# Patient Record
Sex: Male | Born: 1945 | Race: Black or African American | Hispanic: No | Marital: Married | Smoking: Never smoker
Health system: Southern US, Community
[De-identification: ages and names within clinical notes are randomized; demographics above are authoritative.]

## PROBLEM LIST (undated history)

## (undated) DIAGNOSIS — T7840XA Allergy, unspecified, initial encounter: Secondary | ICD-10-CM

## (undated) DIAGNOSIS — E119 Type 2 diabetes mellitus without complications: Secondary | ICD-10-CM

## (undated) DIAGNOSIS — D649 Anemia, unspecified: Secondary | ICD-10-CM

## (undated) HISTORY — DX: Allergy, unspecified, initial encounter: T78.40XA

## (undated) HISTORY — DX: Anemia, unspecified: D64.9

## (undated) HISTORY — DX: Type 2 diabetes mellitus without complications: E11.9

---

## 1988-07-12 HISTORY — PX: INNER EAR SURGERY: SHX679

## 2017-11-25 ENCOUNTER — Inpatient Hospital Stay: Payer: Medicare Other | Attending: Oncology | Admitting: Oncology

## 2017-12-07 ENCOUNTER — Inpatient Hospital Stay: Payer: Medicare Other

## 2017-12-07 ENCOUNTER — Other Ambulatory Visit: Payer: Self-pay

## 2017-12-07 ENCOUNTER — Inpatient Hospital Stay: Payer: Medicare Other | Attending: Hematology and Oncology | Admitting: Hematology and Oncology

## 2017-12-07 ENCOUNTER — Encounter: Payer: Self-pay | Admitting: Hematology and Oncology

## 2017-12-07 VITALS — BP 136/87 | HR 81 | Temp 98.2°F | Resp 18 | Wt 231.0 lb

## 2017-12-07 DIAGNOSIS — D649 Anemia, unspecified: Secondary | ICD-10-CM

## 2017-12-07 DIAGNOSIS — Z809 Family history of malignant neoplasm, unspecified: Secondary | ICD-10-CM | POA: Diagnosis not present

## 2017-12-07 DIAGNOSIS — Z8042 Family history of malignant neoplasm of prostate: Secondary | ICD-10-CM | POA: Diagnosis not present

## 2017-12-07 DIAGNOSIS — D696 Thrombocytopenia, unspecified: Secondary | ICD-10-CM

## 2017-12-07 LAB — CBC WITH DIFFERENTIAL/PLATELET
BASOS ABS: 0 10*3/uL (ref 0–0.1)
BASOS PCT: 0 %
EOS ABS: 0.2 10*3/uL (ref 0–0.7)
Eosinophils Relative: 3 %
HCT: 38.4 % — ABNORMAL LOW (ref 40.0–52.0)
HEMOGLOBIN: 13.5 g/dL (ref 13.0–18.0)
Lymphocytes Relative: 30 %
Lymphs Abs: 2.1 10*3/uL (ref 1.0–3.6)
MCH: 32 pg (ref 26.0–34.0)
MCHC: 35.1 g/dL (ref 32.0–36.0)
MCV: 91.2 fL (ref 80.0–100.0)
MONOS PCT: 9 %
Monocytes Absolute: 0.7 10*3/uL (ref 0.2–1.0)
NEUTROS PCT: 58 %
Neutro Abs: 4 10*3/uL (ref 1.4–6.5)
Platelets: 145 10*3/uL — ABNORMAL LOW (ref 150–440)
RBC: 4.21 MIL/uL — AB (ref 4.40–5.90)
RDW: 13.1 % (ref 11.5–14.5)
WBC: 7.1 10*3/uL (ref 3.8–10.6)

## 2017-12-07 LAB — PROTIME-INR
INR: 1.1
Prothrombin Time: 14.2 seconds (ref 11.4–15.2)

## 2017-12-07 LAB — APTT: aPTT: 29 seconds (ref 24–36)

## 2017-12-07 LAB — PLATELET BY CITRATE: PLATELET CT IN CITRATE: 127

## 2017-12-07 NOTE — Progress Notes (Signed)
[facility]-  Cancer Center  Clinic day:  12/07/2017  Chief Complaint: Cristian Rogers is a 72 y.o. male with thrombocytopenia who is referred in consultation by Titus Mould, NP for assessment and management.  HPI:  The patient notes that he was told about thrombocytopenia 2-3 weeks ago.  He denies any bruising or bleeding.  He is on no new medications except for a cholesterol medication.  He is on saw palmetto for his prostate.  He also takes fish oil and multi-vitamins.  He denies any recent infections.  Regarding his diet, he eats chicken and vegetables.  He denies any pica or restless legs.  CBC on 11/17/2017 revealed a hematocrit of 38.4, hemoglobin 13.4, MCV 92, platelets 141,000, WBC 6400 with an ANC of 3500.  Differential included 54% segs, 34% lymphs, 7% monocytes, 3% eosinophils, and 1% basophils.    Labs dating back to 03/30/2016 revealed a hematocrit 38.4 - 39, hemoglobin 13.5 - 13.7, MCV 90-92, platelets 128,000 - 147,000, and WBC 5700 - 7600.  Creatinine 1.2 -1.5.  Hepatitis C testing was negative on 03/30/2016.  Symptomatically, he feels "pk".  Energy level is good.  Last colonoscopy was < 10 years ago.  His mother and sister died of cancer.  His father died with prostate cancer.  There is no family history of any blood disorders, leukemia or lymphoma.   Past Medical History:  Diagnosis Date  . Allergy   . Anemia   . Diabetes mellitus without complication [facility])     Past Surgical History:  Procedure Laterality Date  . INNER EAR SURGERY  1990    Family History  Problem Relation Age of Onset  . Cancer Mother   . Cancer Sister     Social History:  reports that he has never smoked. He has never used smokeless tobacco. He reports that he drank alcohol. He reports that he does not use drugs.  He drank as a teenager.  He denies any exposure to radiation or toxins.  He lives in [place].  The patient is alone today.  Allergies: No Known  Allergies  Current Medications: Current Outpatient Medications  Medication Sig Dispense Refill  . aspirin 81 MG chewable tablet Chew by mouth.    Marland Kitchen atorvastatin (LIPITOR) 40 MG tablet Take by mouth.    . metFORMIN (GLUCOPHAGE-XR) 500 MG 24 hr tablet Take by mouth.     No current facility-administered medications for this visit.     Review of Systems:  GENERAL:  Feels good.  Active.  No fevers or weight loss. Swets a lot "for awhile". PERFORMANCE STATUS (ECOG):  1 HEENT:  No visual changes, runny nose, sore throat, mouth sores or tenderness. Lungs: No shortness of breath or cough.  No hemoptysis. Cardiac:  No chest pain, palpitations, orthopnea, or PND. GI:  No nausea, vomiting, diarrhea, constipation, melena or hematochezia.  Colonoscopy < 10 years ago. GU:  No urgency, frequency, dysuria, or hematuria. Musculoskeletal:  Sore left side of chest.  No back pain.  No joint pain.  No muscle tenderness. Extremities:  No pain or swelling. Skin:  Rash for years.  No skin changes. Neuro:  No headache, numbness or weakness, balance or coordination issues. Endocrine:  Diabetes.  No thyroid issues, hot flashes or night sweats. Psych:  No mood changes, depression or anxiety. Pain:  No focal pain. Review of systems:  All other systems reviewed and found to be negative.  Physical Exam: Blood pressure 136/87, pulse 81, temperature 98.2 F (  36.8 C), temperature source Tympanic, resp. rate 18, weight 231 lb 0.7 oz (104.8 kg), SpO2 97 %. GENERAL:  Well developed, well nourished, gentleman sitting comfortably in the exam room in no acute distress. MENTAL STATUS:  Alert and oriented to person, place and time. HEAD:  Wearing a cap.  White beard.  Normocephalic, atraumatic, face symmetric, no Cushingoid features. EYES:  Brown eyes.  Pupils equal round and reactive to light and accomodation.  No conjunctivitis or scleral icterus. ENT:  Oropharynx clear without lesion.  Tongue normal. Mucous membranes  moist.  RESPIRATORY:  Clear to auscultation without rales, wheezes or rhonchi. CARDIOVASCULAR:  Regular rate and rhythm without murmur, rub or gallop. ABDOMEN:  Soft, non-tender, with active bowel sounds, and no appreciable hepatosplenomegaly.  No masses. SKIN:  No rashes, ulcers or lesions. EXTREMITIES: No edema, no skin discoloration or tenderness.  No palpable cords. LYMPH NODES: No palpable cervical, supraclavicular, axillary or inguinal adenopathy  NEUROLOGICAL: Unremarkable. PSYCH:  Appropriate.   Appointment on 12/07/2017  Component Date Value Ref Range Status  . HIV Screen 4th Generation wRfx 12/07/2017 Non Reactive  Non Reactive Final   Comment: (NOTE) Performed At: [facility] [address] [place], [place] [id number] Jolene Schimke MD BJ:[id number] Performed at [facility], [address] [place], [place] [address]   . Hepatitis B Surface Ag 12/07/2017 Negative  Negative Final   Comment: (NOTE) Performed At: [facility] [address] [place], [place] [id number] Jolene Schimke MD WU:[id number] Performed at [facility], [address], [place], [place] [address]   . HCV Ab 12/07/2017 <0.1  0.0 - 0.9 s/co ratio Final   Comment: (NOTE)                                  Negative:     < 0.8                             Indeterminate: 0.8 - 0.9                                  Positive:     > 0.9 The CDC recommends that a positive HCV antibody result be followed up with a HCV Nucleic Acid Amplification test (440347). Performed At: [facility] [address] [place], [place] [id number] Jolene Schimke MD [id number] Performed at [facility], [address], [place], [place] [address]   . Hep B Core Total Ab 12/07/2017 Negative  Negative Final   Comment: (NOTE) Performed At: [facility] [address] [place], [place] [id number] Jolene Schimke MD [id number] Performed at [facility], [address], [place], [place] [address]   . Prostatic Specific Antigen 12/07/2017 1.75  0.00 - 4.00 ng/mL Final   Comment: (NOTE) While PSA levels of <=4.0 ng/ml are reported as reference range, some men with levels below 4.0 ng/ml can have prostate cancer and many men with PSA above 4.0 ng/ml do not have prostate cancer.  Other tests such as free PSA, age specific reference ranges, PSA velocity and PSA doubling time may be helpful especially in men less than 49 years old. Performed at [facility], [address], [place], [place] [address]   . Path Review 12/07/2017 Blood smear reviewed. There is mild thrombocytopenia  without clumping. RBCs are unremarkable. WBC and differential are within normal limits. No abnormal or immature leukocytes are seen.   Final   Comment: Reviewed by Elnita Maxwell. Forde Dandy, MD. Performed at [facility], [address] [place], [place] [address]   . Kappa free light chain 12/07/2017 17.9  3.3 - 19.4 mg/L Final  . Lamda free light chains 12/07/2017 15.6  5.7 - 26.3 mg/L Final  . Kappa, lamda light chain ratio 12/07/2017 1.15  0.26 - 1.65 Final   Comment: (NOTE) Performed At: [facility] [address] [place], [place] [id number] Jolene Schimke MD [id number]:[id number] Performed at [facility], [address] [place], [place] [address]   . Retic Ct Pct 12/07/2017 1.7  0.4 - 3.1 % Final  . RBC. 12/07/2017 4.12* 4.40 - 5.90 MIL/uL Final  . Retic Count, Absolute 12/07/2017 70.0  19.0 - 183.0 K/uL Final   Performed at [facility], [address], [place], [place] [address]  . Total Protein ELP 12/07/2017 6.7  6.0 - 8.5 g/dL Final  . Albumin ELP 62/95/2841 3.8  2.9 - 4.4 g/dL Final  . LKGMW-1-UUVOZDGU 12/07/2017 0.2  0.0 - 0.4 g/dL Final  . YQIHK-7-QQVZDGLO 12/07/2017 0.8  0.4 - 1.0 g/dL Final  . Beta Globulin 12/07/2017 0.9  0.7 - 1.3 g/dL Final  . Gamma Globulin 12/07/2017 1.0  0.4 - 1.8 g/dL Final  .  M-Spike, % 75/64/3329 Not Observed  Not Observed g/dL Final  . SPE Interp. 51/88/4166 Comment   Final   Comment: (NOTE) The SPE pattern appears essentially unremarkable. Evidence of monoclonal protein is not apparent. Performed At: [facility] [address] [place], [place] [id number] Jolene Schimke MD [id number]   . Comment 12/07/2017 Comment   Final   Comment: (NOTE) Protein electrophoresis scan will follow via computer, mail, or courier delivery.   Marland Kitchen GLOBULIN, TOTAL 12/07/2017 2.9  2.2 - 3.9 g/dL Corrected  . A/G Ratio 12/07/2017 1.3  0.7 - 1.7 Corrected   Performed at [facility], [address] [place], [place] [address]  . Iron 12/07/2017 73  45 - 182 ug/dL Final  . TIBC 62/37/6283 299  250 - 450 ug/dL Final  . Saturation Ratios 12/07/2017 24  17.9 - 39.5 % Final  . UIBC 12/07/2017 226  ug/dL Final   Performed at [facility], [address], [place], [place] [address]  . Folate 12/07/2017 12.9  >5.9 ng/mL Final   Performed at [facility], [address] [place] [place], [place] [address]  . Ferritin 12/07/2017 157  24 - 336 ng/mL Final   Performed at [facility], [address] [place] [place], [place] [address]  . Vitamin B-12 12/07/2017 201  180 - 914 pg/mL Final   Comment: (NOTE) This assay is not validated for testing neonatal or myeloproliferative syndrome specimens for Vitamin B12 levels. Performed at [facility], [address], [place], [place] [address]   . Anit Nuclear Antibody(ANA) 12/07/2017 Negative  Negative Final   Comment: (NOTE) Performed At: [facility] [address] [place], [place] [id number] Jolene Schimke MD XF:[id number] Performed at [facility], [address] [place], [place] [address]   . aPTT 12/07/2017 29  24 - 36 seconds Final   Performed at [facility], [address] [place] [place], [place] [address]  . Prothrombin Time 12/07/2017 14.2  11.4 - 15.2  seconds Final  . INR 12/07/2017 1.10   Final   Performed at  [facility], [address], [place], [place] [address]  . Platelet CT in [place] 12/07/2017 127   Final   Comment: PLATELET COUNT PERFORMED ON CITRATED BLOOD Performed at [facility], [address], [place], [place] [address]   . WBC 12/07/2017 7.1  3.8 - 10.6 K/uL Final  . RBC 12/07/2017 4.21* 4.40 - 5.90 MIL/uL Final  . Hemoglobin 12/07/2017 13.5  13.0 - 18.0 g/dL Final  . HCT 09/81/1914 38.4* 40.0 - 52.0 % Final  . MCV 12/07/2017 91.2  80.0 - 100.0 fL Final  . MCH 12/07/2017 32.0  26.0 - 34.0 pg Final  . MCHC 12/07/2017 35.1  32.0 - 36.0 g/dL Final  . RDW 78/29/5621 13.1  11.5 - 14.5 % Final  . Platelets 12/07/2017 145* 150 - 440 K/uL Final  . Neutrophils Relative % 12/07/2017 58  % Final  . Neutro Abs 12/07/2017 4.0  1.4 - 6.5 K/uL Final  . Lymphocytes Relative 12/07/2017 30  % Final  . Lymphs Abs 12/07/2017 2.1  1.0 - 3.6 K/uL Final  . Monocytes Relative 12/07/2017 9  % Final  . Monocytes Absolute 12/07/2017 0.7  0.2 - 1.0 K/uL Final  . Eosinophils Relative 12/07/2017 3  % Final  . Eosinophils Absolute 12/07/2017 0.2  0 - 0.7 K/uL Final  . Basophils Relative 12/07/2017 0  % Final  . Basophils Absolute 12/07/2017 0.0  0 - 0.1 K/uL Final   Performed at [facility], [address], [place], [place] [address]    Assessment:  REISS MOWREY is a 72 y.o. male with mild thrombocytopenia.  He denies any new medications or herbal products except for saw palmetto and Lipitor (incidence of thrombocytopenia < 1%).  He denies any alcohol use.  Diet is fair.   He has a mild normocytic anemia.  Last colonoscopy was < 10 years ago.  He has a family history of malignancy.  His father died of prostate cancer.  His mother and sister died of breast cancer.  Symptomatically, he feels good.  He denies any bruising or bleeding.  Exam is unremarkable.  Plan: 1.   Discuss differential diagnosis  of thrombocytopenia.  Differential includes pseudo-thrombocytopenia, decreased production, increased destruction, and sequestration.  No medications or herbal products implicated.  Diet is fair.  Discuss work-up. 2.   Labs:  CBC with diff, platelet count blue top tube, PT, PTT, ANA, B12, folate, ferritin, iron studies, hepatitis B core antibody, hepatitis C antibody, SPEP, FLCA, retic.  PSA. 3.   Peripheral smear for review. 4.   RTC in 1 week for MD assessment and review of work-up and discussion regarding direction of therapy.   Rosey Bath, MD  12/07/2017, 5:35 PM

## 2017-12-07 NOTE — Progress Notes (Signed)
New patient in for anemia and thrombocytopenia.  Pt denies any bleeding or concerns.  States he"s unsure "why I'm here".

## 2017-12-08 LAB — HEPATITIS B CORE ANTIBODY, TOTAL: Hep B Core Total Ab: NEGATIVE

## 2017-12-08 LAB — FOLATE: Folate: 12.9 ng/mL (ref >5.9)

## 2017-12-08 LAB — HEPATITIS C ANTIBODY: HCV Ab: <0.1 s/co ratio (ref 0.0–0.9)

## 2017-12-08 LAB — IRON AND TIBC
IRON: 73 ug/dL (ref 45–182)
SATURATION RATIOS: 24 % (ref 17.9–39.5)
TIBC: 299 ug/dL (ref 250–450)
UIBC: 226 ug/dL

## 2017-12-08 LAB — PATHOLOGIST SMEAR REVIEW

## 2017-12-08 LAB — RETICULOCYTES
RBC.: 4.12 MIL/uL — ABNORMAL LOW (ref 4.40–5.90)
Retic Count, Absolute: 70 10*3/uL (ref 19.0–183.0)
Retic Ct Pct: 1.7 % (ref 0.4–3.1)

## 2017-12-08 LAB — KAPPA/LAMBDA LIGHT CHAINS
KAPPA, LAMDA LIGHT CHAIN RATIO: 1.15 (ref 0.26–1.65)
Kappa free light chain: 17.9 mg/L (ref 3.3–19.4)
LAMDA FREE LIGHT CHAINS: 15.6 mg/L (ref 5.7–26.3)

## 2017-12-08 LAB — HIV ANTIBODY (ROUTINE TESTING W REFLEX): HIV Screen 4th Generation wRfx: NONREACTIVE

## 2017-12-08 LAB — FERRITIN: Ferritin: 157 ng/mL (ref 24–336)

## 2017-12-08 LAB — PSA: PROSTATIC SPECIFIC ANTIGEN: 1.75 ng/mL (ref 0.00–4.00)

## 2017-12-08 LAB — HEPATITIS B SURFACE ANTIGEN: HEP B S AG: NEGATIVE

## 2017-12-08 LAB — ANA W/REFLEX: Anti Nuclear Antibody(ANA): NEGATIVE

## 2017-12-08 LAB — VITAMIN B12: Vitamin B-12: 201 pg/mL (ref 180–914)

## 2017-12-09 LAB — PROTEIN ELECTROPHORESIS, SERUM
A/G RATIO SPE: 1.3 (ref 0.7–1.7)
ALBUMIN ELP: 3.8 g/dL (ref 2.9–4.4)
ALPHA-1-GLOBULIN: 0.2 g/dL (ref 0.0–0.4)
Alpha-2-Globulin: 0.8 g/dL (ref 0.4–1.0)
Beta Globulin: 0.9 g/dL (ref 0.7–1.3)
GLOBULIN, TOTAL: 2.9 g/dL (ref 2.2–3.9)
Gamma Globulin: 1 g/dL (ref 0.4–1.8)
TOTAL PROTEIN ELP: 6.7 g/dL (ref 6.0–8.5)

## 2017-12-14 ENCOUNTER — Inpatient Hospital Stay: Payer: Medicare Other

## 2017-12-14 ENCOUNTER — Inpatient Hospital Stay: Payer: Medicare Other | Attending: Hematology and Oncology | Admitting: Hematology and Oncology

## 2017-12-14 VITALS — BP 141/81 | HR 73 | Resp 18 | Wt 229.7 lb

## 2017-12-14 DIAGNOSIS — Z8042 Family history of malignant neoplasm of prostate: Secondary | ICD-10-CM | POA: Diagnosis not present

## 2017-12-14 DIAGNOSIS — Z803 Family history of malignant neoplasm of breast: Secondary | ICD-10-CM | POA: Diagnosis not present

## 2017-12-14 DIAGNOSIS — D649 Anemia, unspecified: Secondary | ICD-10-CM | POA: Diagnosis not present

## 2017-12-14 DIAGNOSIS — E538 Deficiency of other specified B group vitamins: Secondary | ICD-10-CM

## 2017-12-14 DIAGNOSIS — D696 Thrombocytopenia, unspecified: Secondary | ICD-10-CM | POA: Diagnosis not present

## 2017-12-14 MED ORDER — CYANOCOBALAMIN 1000 MCG/ML IJ SOLN
INTRAMUSCULAR | Status: AC
  Filled 2017-12-14: qty 1

## 2017-12-14 MED ORDER — CYANOCOBALAMIN 1000 MCG/ML IJ SOLN
1000.0000 ug | Freq: Once | INTRAMUSCULAR | Status: AC
  Administered 2017-12-14: 1000 ug via INTRAMUSCULAR

## 2017-12-14 NOTE — Progress Notes (Signed)
[facility]-  Cancer Center  Clinic day:  12/14/2017  Chief Complaint: Cristian Rogers is a 72 y.o. male with thrombocytopenia who is seen for review of work-up and discussion regarding direction of therapy.  HPI:  The patient was last seen in the hematology clinic on 12/07/2017 for initial consultation.  He had mild thrombocytopenia.  Work-up revealed a hematocrit of 38.4, hemoglobin 13.5, MCV 91.2, platelets 145,000, WBC 7100 with an ANC of 4000.  Differential was unremarkable.  Normal studies included:  ANA, hepatitis B core antibody total, hepatitis B surface antigen, hepatitis C antibody, HIV testing, SPEP, and free light chains.  PT and PTT were normal.  Ferritin was 157.  Iron saturation was 24% with a TIBC of 299.  Folate was 12.9.  Retic was 1.7%.  PSA was 1.75.  B12 was 201 (low).  Peripheral smear revealed mild thrombocytopenia without clumping. RBCs were unremarkable. WBC and differential were within normal limits. There were no abnormal or immature leukocytes are seen.   Platelet count in a blue top tube was 127,000.  Symptomatically, patient is doing well today. He does not express any acute concerns. Patient denies B symptoms. He has not experienced any significant interval infections. Patient notes that he is eating well. Weight has decreased by 2 pounds.   Patient denies pain in the clinic today.    Past Medical History:  Diagnosis Date  . Allergy   . Anemia   . Diabetes mellitus without complication [facility](HCC)     Past Surgical History:  Procedure Laterality Date  . INNER EAR SURGERY  1990    Family History  Problem Relation Age of Onset  . Cancer Mother   . Cancer Sister     Social History:  reports that he has never smoked. He has never used smokeless tobacco. He reports that he drank alcohol. He reports that he does not use drugs.  He drank as a teenager.  He denies any exposure to radiation or toxins.  He lives in [place].  The patient is  accompanied by his wife, Claris CheMargaret, today.  Allergies: No Known Allergies  Current Medications: Current Outpatient Medications  Medication Sig Dispense Refill  . aspirin 81 MG chewable tablet Chew by mouth.    Marland Kitchen. atorvastatin (LIPITOR) 40 MG tablet Take by mouth.    . metFORMIN (GLUCOPHAGE-XR) 500 MG 24 hr tablet Take by mouth.     No current facility-administered medications for this visit.     Review of Systems  Constitutional: Negative for diaphoresis, fever, malaise/fatigue and weight loss.       "I feel good".   HENT: Negative.  Negative for congestion, nosebleeds, sinus pain and sore throat.   Eyes: Negative.  Negative for double vision, photophobia, pain and discharge.  Respiratory: Negative for cough, hemoptysis, sputum production and shortness of breath.   Cardiovascular: Negative for chest pain, palpitations, orthopnea, leg swelling and PND.  Gastrointestinal: Negative for abdominal pain, blood in stool, constipation, diarrhea, melena, nausea and vomiting.  Genitourinary: Negative for dysuria, frequency, hematuria and urgency.  Musculoskeletal: Negative for back pain, falls, joint pain and myalgias.  Skin: Positive for rash (for years). Negative for itching.  Neurological: Negative for dizziness, tremors, weakness and headaches.  Endo/Heme/Allergies: Does not bruise/bleed easily.       Diabetes  Psychiatric/Behavioral: Negative for depression, memory loss and suicidal ideas. The patient is not nervous/anxious and does not have insomnia.   All other systems reviewed and are negative.  Performance status (ECOG): 1 -  Symptomatic but completely ambulatory  Physical Exam: Blood pressure (!) 141/81, pulse 73, resp. rate 18, weight 229 lb 11.5 oz (104.2 kg), SpO2 100 %. GENERAL:  Well developed, well nourished, gentleman sitting comfortably in the exam room in no acute distress. MENTAL STATUS:  Alert and oriented to person, place and time. HEAD:  Gray/white beard.   Normocephalic, atraumatic, face symmetric, no Cushingoid features. EYES:  Brown eyes.  No conjunctivitis or scleral icterus. NEUROLOGICAL:  Unremarkable. PSYCH:  Appropriate.    No visits with results within 3 Day(s) from this visit.  Latest known visit with results is:  Appointment on 12/07/2017  Component Date Value Ref Range Status  . HIV Screen 4th Generation wRfx 12/07/2017 Non Reactive  Non Reactive Final   Comment: (NOTE) Performed At: [facility] [address] [place], [place] [id number] Jolene Schimke MD WU:[id number] Performed at [facility], [address] [place], [place] [address]   . Hepatitis B Surface Ag 12/07/2017 Negative  Negative Final   Comment: (NOTE) Performed At: [facility] [address] [place], [place] [id number] Jolene Schimke MD [id number] Performed at [facility], [address] [place], [place] [address]   . HCV Ab 12/07/2017 <0.1  0.0 - 0.9 s/co ratio Final   Comment: (NOTE)                                  Negative:     < 0.8                             Indeterminate: 0.8 - 0.9                                  Positive:     > 0.9 The CDC recommends that a positive HCV antibody result be followed up with a HCV Nucleic Acid Amplification test (725366). Performed At: [facility] [address] [place], [place] [id number] Jolene Schimke MD ZD:[id number] Performed at [facility], [address] [place], [place] [address]   . Hep B Core Total Ab 12/07/2017 Negative  Negative Final   Comment: (NOTE) Performed At: [facility] [address] [place], [place] [id number] Jolene Schimke MD SW:[id number] Performed at [facility], [address], [place], [place] [address]   . Prostatic Specific Antigen 12/07/2017 1.75  0.00 - 4.00 ng/mL Final   Comment: (NOTE) While PSA levels of <=4.0 ng/ml are reported as reference range, some men with levels  below 4.0 ng/ml can have prostate cancer and many men with PSA above 4.0 ng/ml do not have prostate cancer.  Other tests such as free PSA, age specific reference ranges, PSA velocity and PSA doubling time may be helpful especially in men less than 39 years old. Performed at [facility], [address] [place], [place] [address]   . Path Review 12/07/2017 Blood smear reviewed. There is mild thrombocytopenia without clumping. RBCs are unremarkable. WBC and differential are within normal limits. No abnormal or immature leukocytes are seen.   Final   Comment: Reviewed by Elnita Maxwell. Forde Dandy, MD. Performed at [facility], [address], [place], [place] [address]   . Kappa free light chain 12/07/2017 17.9  3.3 - 19.4 mg/L Final  . Lamda free light chains 12/07/2017  15.6  5.7 - 26.3 mg/L Final  . Kappa, lamda light chain ratio 12/07/2017 1.15  0.26 - 1.65 Final   Comment: (NOTE) Performed At: [facility] [address] [place], [place] [id number] Jolene Schimke MD WU:[id number] Performed at [facility], [address] [place], [place] [address]   . Retic Ct Pct 12/07/2017 1.7  0.4 - 3.1 % Final  . RBC. 12/07/2017 4.12* 4.40 - 5.90 MIL/uL Final  . Retic Count, Absolute 12/07/2017 70.0  19.0 - 183.0 K/uL Final   Performed at [facility], [address], [place], [place] [address]  . Total Protein ELP 12/07/2017 6.7  6.0 - 8.5 g/dL Final  . Albumin ELP 78/46/9629 3.8  2.9 - 4.4 g/dL Final  . BMWUX-3-KGMWNUUV 12/07/2017 0.2  0.0 - 0.4 g/dL Final  . OZDGU-4-QIHKVQQV 12/07/2017 0.8  0.4 - 1.0 g/dL Final  . Beta Globulin 12/07/2017 0.9  0.7 - 1.3 g/dL Final  . Gamma Globulin 12/07/2017 1.0  0.4 - 1.8 g/dL Final  . M-Spike, % 95/63/8756 Not Observed  Not Observed g/dL Final  . SPE Interp. 43/32/9518 Comment   Final   Comment: (NOTE) The SPE pattern appears essentially unremarkable. Evidence of monoclonal protein is not apparent. Performed At: [facility] [address] [place], [place] [id number] Jolene Schimke MD ZS:[id number]   . Comment 12/07/2017 Comment   Final   Comment: (NOTE) Protein electrophoresis scan will follow via computer, mail, or courier delivery.   Marland Kitchen GLOBULIN, TOTAL 12/07/2017 2.9  2.2 - 3.9 g/dL Corrected  . A/G Ratio 12/07/2017 1.3  0.7 - 1.7 Corrected   Performed at [facility], [address], [place], [place] [address]  . Iron 12/07/2017 73  45 - 182 ug/dL Final  . TIBC 54/27/0623 299  250 - 450 ug/dL Final  . Saturation Ratios 12/07/2017 24  17.9 - 39.5 % Final  . UIBC 12/07/2017 226  ug/dL Final   Performed at [facility], [address], [place], [place] [address]  . Folate 12/07/2017 12.9  >5.9 ng/mL Final   Performed at [facility], [address] [place] [place], [place] [address]  . Ferritin 12/07/2017 157  24 - 336 ng/mL Final   Performed at [facility], [address] [place] [place], [place] [address]  . Vitamin B-12 12/07/2017 201  180 - 914 pg/mL Final   Comment: (NOTE) This assay is not validated for testing neonatal or myeloproliferative syndrome specimens for Vitamin B12 levels. Performed at [facility], [address], [place], [place] [address]   . Anti Nuclear Antibody(ANA) 12/07/2017 Negative  Negative Final   Comment: (NOTE) Performed At: [facility] [address] [place], [place] [id number] Jolene Schimke MD OJ:[id number] Performed at [facility], [address] [place], [place] [address]   . aPTT 12/07/2017 29  24 - 36 seconds Final   Performed at [facility], [address] [place] [place], [place] [address]  . Prothrombin Time 12/07/2017 14.2  11.4 - 15.2 seconds Final  . INR 12/07/2017 1.10   Final   Performed at [facility], [address], [place], [place] [address]  . Platelet CT in [place] 12/07/2017 127   Final   Comment: PLATELET COUNT PERFORMED ON CITRATED  BLOOD Performed at [facility], [address], [place], [place] [address]   . WBC 12/07/2017 7.1  3.8 - 10.6 K/uL Final  . RBC 12/07/2017 4.21* 4.40 - 5.90 MIL/uL  Final  . Hemoglobin 12/07/2017 13.5  13.0 - 18.0 g/dL Final  . HCT 16/04/9603 38.4* 40.0 - 52.0 % Final  . MCV 12/07/2017 91.2  80.0 - 100.0 fL Final  . MCH 12/07/2017 32.0  26.0 - 34.0 pg Final  . MCHC 12/07/2017 35.1  32.0 - 36.0 g/dL Final  . RDW 54/03/8118 13.1  11.5 - 14.5 % Final  . Platelets 12/07/2017 145* 150 - 440 K/uL Final  . Neutrophils Relative % 12/07/2017 58  % Final  . Neutro Abs 12/07/2017 4.0  1.4 - 6.5 K/uL Final  . Lymphocytes Relative 12/07/2017 30  % Final  . Lymphs Abs 12/07/2017 2.1  1.0 - 3.6 K/uL Final  . Monocytes Relative 12/07/2017 9  % Final  . Monocytes Absolute 12/07/2017 0.7  0.2 - 1.0 K/uL Final  . Eosinophils Relative 12/07/2017 3  % Final  . Eosinophils Absolute 12/07/2017 0.2  0 - 0.7 K/uL Final  . Basophils Relative 12/07/2017 0  % Final  . Basophils Absolute 12/07/2017 0.0  0 - 0.1 K/uL Final   Performed at [facility], [address] [place], [place] [address]    Assessment:  ISAUL LANDI is a 72 y.o. male with mild thrombocytopenia and B12 deficiency.  He denies any new medications or herbal products except for saw palmetto and Lipitor (incidence of thrombocytopenia < 1%).  He denies any alcohol use.  Diet is fair.   Work-up on 12/07/2017 revealed revealed a hematocrit of 38.4, hemoglobin 13.5, MCV 91.2, platelets 145,000, WBC 7100 with an ANC of 4000.  Differential was unremarkable.  Normal studies included:  ANA, hepatitis B core antibody total, hepatitis B surface antigen, hepatitis C antibody, HIV testing, SPEP, and free light chains.  PT and PTT were normal.  Ferritin was 157.  Iron saturation was 24% with a TIBC of 299.  Folate was 12.9.  Retic was 1.7%.  B12 was 201 (low).  He has a mild normocytic anemia.  Last colonoscopy was < 10 years  ago.  He has a family history of malignancy.  His father died of prostate cancer.  His mother and sister died of breast cancer.  PSA was 1.75.    Symptomatically, he feels good.  He denies any bruising or bleeding.  Exam is unremarkable.  Plan: 1. Review work-up.  Only notable finding is B12 deficiency. 2. Discuss B12 deficiency. Review oral versus parenteral supplementation. Patient has been on injections in the past, however they were discontinued for an unknown reason. Patient wishes to restart parenteral supplementation. Will schedule patient for weekly B12 x 6 (first injection today), then monthly.  3.   RTC on 02/15/2018 for MD assessment, labs (CBC with diff, BMP, antiparietal Ab, intrinsic factor), and FIRST monthly B12 injection.    Quentin Mulling 12/14/2017, 4:02 PM  I saw and evaluated the patient, participating in the key portions of the service and reviewing pertinent diagnostic studies and records.  I reviewed the nurse practitioner's note and agree with the findings and the plan.  The assessment and plan were discussed with the patient.  Several questions were asked by the patient and answered.   Nelva Nay, MD 12/14/2017,4:02 PM

## 2017-12-14 NOTE — Progress Notes (Signed)
Patient here today for review of workup from last week.

## 2017-12-14 NOTE — Progress Notes (Signed)
Cristian GrieveBryan NP states B12  Injection is authorized per [name]. B12 given.

## 2017-12-16 ENCOUNTER — Encounter: Payer: Self-pay | Admitting: Family Medicine

## 2017-12-21 ENCOUNTER — Inpatient Hospital Stay: Payer: Medicare Other

## 2017-12-21 VITALS — BP 145/79 | HR 76 | Resp 18

## 2017-12-21 DIAGNOSIS — D696 Thrombocytopenia, unspecified: Secondary | ICD-10-CM | POA: Diagnosis not present

## 2017-12-21 DIAGNOSIS — E538 Deficiency of other specified B group vitamins: Secondary | ICD-10-CM

## 2017-12-21 MED ORDER — CYANOCOBALAMIN 1000 MCG/ML IJ SOLN
1000.0000 ug | Freq: Once | INTRAMUSCULAR | Status: AC
  Administered 2017-12-21: 1000 ug via INTRAMUSCULAR

## 2017-12-21 NOTE — Patient Instructions (Signed)
Cyanocobalamin, Vitamin B12 injection What is this medicine? CYANOCOBALAMIN (sye an oh koe BAL a min) is a man made form of vitamin B12. Vitamin B12 is used in the growth of healthy blood cells, nerve cells, and proteins in the body. It also helps with the metabolism of fats and carbohydrates. This medicine is used to treat people who can not absorb vitamin B12. This medicine may be used for other purposes; ask your health care provider or pharmacist if you have questions. COMMON BRAND NAME(S): B-12 Compliance Kit, B-12 Injection Kit, Cyomin, LA-12, Nutri-Twelve, Physicians EZ Use B-12, Primabalt What should I tell my health care provider before I take this medicine? They need to know if you have any of these conditions: -kidney disease -Leber's disease -megaloblastic anemia -an unusual or allergic reaction to cyanocobalamin, cobalt, other medicines, foods, dyes, or preservatives -pregnant or trying to get pregnant -breast-feeding How should I use this medicine? This medicine is injected into a muscle or deeply under the skin. It is usually given by a health care professional in a clinic or doctor's office. However, your doctor may teach you how to inject yourself. Follow all instructions. Talk to your pediatrician regarding the use of this medicine in children. Special care may be needed. Overdosage: If you think you have taken too much of this medicine contact a poison control center or emergency room at once. NOTE: This medicine is only for you. Do not share this medicine with others. What if I miss a dose? If you are given your dose at a clinic or doctor's office, call to reschedule your appointment. If you give your own injections and you miss a dose, take it as soon as you can. If it is almost time for your next dose, take only that dose. Do not take double or extra doses. What may interact with this medicine? -colchicine -heavy alcohol intake This list may not describe all possible  interactions. Give your health care provider a list of all the medicines, herbs, non-prescription drugs, or dietary supplements you use. Also tell them if you smoke, drink alcohol, or use illegal drugs. Some items may interact with your medicine. What should I watch for while using this medicine? Visit your doctor or health care professional regularly. You may need blood work done while you are taking this medicine. You may need to follow a special diet. Talk to your doctor. Limit your alcohol intake and avoid smoking to get the best benefit. What side effects may I notice from receiving this medicine? Side effects that you should report to your doctor or health care professional as soon as possible: -allergic reactions like skin rash, itching or hives, swelling of the face, lips, or tongue -blue tint to skin -chest tightness, pain -difficulty breathing, wheezing -dizziness -red, swollen painful area on the leg Side effects that usually do not require medical attention (report to your doctor or health care professional if they continue or are bothersome): -diarrhea -headache This list may not describe all possible side effects. Call your doctor for medical advice about side effects. You may report side effects to FDA at 1-800-FDA-1088. Where should I keep my medicine? Keep out of the reach of children. Store at room temperature between 15 and 30 degrees C (59 and 85 degrees F). Protect from light. Throw away any unused medicine after the expiration date. NOTE: This sheet is a summary. It may not cover all possible information. If you have questions about this medicine, talk to your doctor, pharmacist, or   health care provider.  2018 Elsevier/Gold Standard (2007-10-09 22:10:20)  

## 2017-12-28 ENCOUNTER — Inpatient Hospital Stay: Payer: Medicare Other

## 2017-12-28 DIAGNOSIS — E538 Deficiency of other specified B group vitamins: Secondary | ICD-10-CM

## 2017-12-28 DIAGNOSIS — D696 Thrombocytopenia, unspecified: Secondary | ICD-10-CM | POA: Diagnosis not present

## 2017-12-28 MED ORDER — CYANOCOBALAMIN 1000 MCG/ML IJ SOLN
1000.0000 ug | Freq: Once | INTRAMUSCULAR | Status: AC
  Administered 2017-12-28: 1000 ug via INTRAMUSCULAR

## 2018-01-04 ENCOUNTER — Inpatient Hospital Stay: Payer: Medicare Other

## 2018-01-04 VITALS — BP 102/67 | HR 74 | Temp 97.6°F | Resp 18

## 2018-01-04 DIAGNOSIS — E538 Deficiency of other specified B group vitamins: Secondary | ICD-10-CM

## 2018-01-04 DIAGNOSIS — D696 Thrombocytopenia, unspecified: Secondary | ICD-10-CM | POA: Diagnosis not present

## 2018-01-04 MED ORDER — CYANOCOBALAMIN 1000 MCG/ML IJ SOLN
1000.0000 ug | Freq: Once | INTRAMUSCULAR | Status: AC
  Administered 2018-01-04: 1000 ug via INTRAMUSCULAR

## 2018-01-04 MED ORDER — CYANOCOBALAMIN 1000 MCG/ML IJ SOLN
INTRAMUSCULAR | Status: AC
  Filled 2018-01-04: qty 1

## 2018-01-08 ENCOUNTER — Encounter: Payer: Self-pay | Admitting: Hematology and Oncology

## 2018-01-11 ENCOUNTER — Ambulatory Visit: Payer: Medicare Other

## 2018-01-13 ENCOUNTER — Inpatient Hospital Stay: Payer: Medicare Other | Attending: Hematology and Oncology

## 2018-01-13 VITALS — BP 132/83 | HR 88 | Resp 18

## 2018-01-13 DIAGNOSIS — E538 Deficiency of other specified B group vitamins: Secondary | ICD-10-CM

## 2018-01-13 DIAGNOSIS — E559 Vitamin D deficiency, unspecified: Secondary | ICD-10-CM | POA: Insufficient documentation

## 2018-01-13 MED ORDER — CYANOCOBALAMIN 1000 MCG/ML IJ SOLN
1000.0000 ug | Freq: Once | INTRAMUSCULAR | Status: AC
  Administered 2018-01-13: 1000 ug via INTRAMUSCULAR

## 2018-01-13 NOTE — Patient Instructions (Signed)
Cyanocobalamin, Vitamin B12 injection What is this medicine? CYANOCOBALAMIN (sye an oh koe BAL a min) is a man made form of vitamin B12. Vitamin B12 is used in the growth of healthy blood cells, nerve cells, and proteins in the body. It also helps with the metabolism of fats and carbohydrates. This medicine is used to treat people who can not absorb vitamin B12. This medicine may be used for other purposes; ask your health care provider or pharmacist if you have questions. COMMON BRAND NAME(S): B-12 Compliance Kit, B-12 Injection Kit, Cyomin, LA-12, Nutri-Twelve, Physicians EZ Use B-12, Primabalt What should I tell my health care provider before I take this medicine? They need to know if you have any of these conditions: -kidney disease -Leber's disease -megaloblastic anemia -an unusual or allergic reaction to cyanocobalamin, cobalt, other medicines, foods, dyes, or preservatives -pregnant or trying to get pregnant -breast-feeding How should I use this medicine? This medicine is injected into a muscle or deeply under the skin. It is usually given by a health care professional in a clinic or doctor's office. However, your doctor may teach you how to inject yourself. Follow all instructions. Talk to your pediatrician regarding the use of this medicine in children. Special care may be needed. Overdosage: If you think you have taken too much of this medicine contact a poison control center or emergency room at once. NOTE: This medicine is only for you. Do not share this medicine with others. What if I miss a dose? If you are given your dose at a clinic or doctor's office, call to reschedule your appointment. If you give your own injections and you miss a dose, take it as soon as you can. If it is almost time for your next dose, take only that dose. Do not take double or extra doses. What may interact with this medicine? -colchicine -heavy alcohol intake This list may not describe all possible  interactions. Give your health care provider a list of all the medicines, herbs, non-prescription drugs, or dietary supplements you use. Also tell them if you smoke, drink alcohol, or use illegal drugs. Some items may interact with your medicine. What should I watch for while using this medicine? Visit your doctor or health care professional regularly. You may need blood work done while you are taking this medicine. You may need to follow a special diet. Talk to your doctor. Limit your alcohol intake and avoid smoking to get the best benefit. What side effects may I notice from receiving this medicine? Side effects that you should report to your doctor or health care professional as soon as possible: -allergic reactions like skin rash, itching or hives, swelling of the face, lips, or tongue -blue tint to skin -chest tightness, pain -difficulty breathing, wheezing -dizziness -red, swollen painful area on the leg Side effects that usually do not require medical attention (report to your doctor or health care professional if they continue or are bothersome): -diarrhea -headache This list may not describe all possible side effects. Call your doctor for medical advice about side effects. You may report side effects to FDA at 1-800-FDA-1088. Where should I keep my medicine? Keep out of the reach of children. Store at room temperature between 15 and 30 degrees C (59 and 85 degrees F). Protect from light. Throw away any unused medicine after the expiration date. NOTE: This sheet is a summary. It may not cover all possible information. If you have questions about this medicine, talk to your doctor, pharmacist, or   health care provider.  2018 Elsevier/Gold Standard (2007-10-09 22:10:20)  

## 2018-01-18 ENCOUNTER — Inpatient Hospital Stay: Payer: Medicare Other

## 2018-01-18 DIAGNOSIS — E559 Vitamin D deficiency, unspecified: Secondary | ICD-10-CM | POA: Diagnosis not present

## 2018-01-18 DIAGNOSIS — E538 Deficiency of other specified B group vitamins: Secondary | ICD-10-CM

## 2018-01-18 MED ORDER — CYANOCOBALAMIN 1000 MCG/ML IJ SOLN
1000.0000 ug | Freq: Once | INTRAMUSCULAR | Status: AC
  Administered 2018-01-18: 1000 ug via INTRAMUSCULAR

## 2018-02-15 ENCOUNTER — Inpatient Hospital Stay: Payer: Medicare Other

## 2018-02-15 ENCOUNTER — Inpatient Hospital Stay: Payer: Medicare Other | Attending: Hematology and Oncology | Admitting: Hematology and Oncology

## 2018-02-15 ENCOUNTER — Encounter: Payer: Self-pay | Admitting: Hematology and Oncology

## 2018-02-15 VITALS — BP 133/74 | HR 85 | Temp 97.3°F | Resp 18 | Wt 227.7 lb

## 2018-02-15 DIAGNOSIS — Z79899 Other long term (current) drug therapy: Secondary | ICD-10-CM | POA: Insufficient documentation

## 2018-02-15 DIAGNOSIS — E119 Type 2 diabetes mellitus without complications: Secondary | ICD-10-CM | POA: Diagnosis not present

## 2018-02-15 DIAGNOSIS — Z7982 Long term (current) use of aspirin: Secondary | ICD-10-CM | POA: Diagnosis not present

## 2018-02-15 DIAGNOSIS — E538 Deficiency of other specified B group vitamins: Secondary | ICD-10-CM | POA: Diagnosis not present

## 2018-02-15 DIAGNOSIS — Z803 Family history of malignant neoplasm of breast: Secondary | ICD-10-CM | POA: Insufficient documentation

## 2018-02-15 DIAGNOSIS — Z8042 Family history of malignant neoplasm of prostate: Secondary | ICD-10-CM | POA: Insufficient documentation

## 2018-02-15 DIAGNOSIS — D696 Thrombocytopenia, unspecified: Secondary | ICD-10-CM | POA: Insufficient documentation

## 2018-02-15 DIAGNOSIS — D649 Anemia, unspecified: Secondary | ICD-10-CM

## 2018-02-15 DIAGNOSIS — Z7984 Long term (current) use of oral hypoglycemic drugs: Secondary | ICD-10-CM | POA: Diagnosis not present

## 2018-02-15 LAB — BASIC METABOLIC PANEL
Anion gap: 12 (ref 5–15)
BUN: 17 mg/dL (ref 8–23)
CO2: 24 mmol/L (ref 22–32)
Calcium: 9.3 mg/dL (ref 8.9–10.3)
Chloride: 105 mmol/L (ref 98–111)
Creatinine, Ser: 1.23 mg/dL (ref 0.61–1.24)
GFR calc Af Amer: >60 mL/min (ref >60)
GFR calc non Af Amer: 58 mL/min — ABNORMAL LOW (ref >60)
Glucose, Bld: 168 mg/dL — ABNORMAL HIGH (ref 70–99)
Potassium: 4.2 mmol/L (ref 3.5–5.1)
Sodium: 141 mmol/L (ref 135–145)

## 2018-02-15 LAB — CBC WITH DIFFERENTIAL/PLATELET
Basophils Absolute: 0 10*3/uL (ref 0–0.1)
Basophils Relative: 0 %
Eosinophils Absolute: 0.2 10*3/uL (ref 0–0.7)
Eosinophils Relative: 4 %
HCT: 40.4 % (ref 40.0–52.0)
Hemoglobin: 13.9 g/dL (ref 13.0–18.0)
Lymphocytes Relative: 30 %
Lymphs Abs: 2 10*3/uL (ref 1.0–3.6)
MCH: 31.6 pg (ref 26.0–34.0)
MCHC: 34.4 g/dL (ref 32.0–36.0)
MCV: 91.7 fL (ref 80.0–100.0)
Monocytes Absolute: 0.5 10*3/uL (ref 0.2–1.0)
Monocytes Relative: 7 %
Neutro Abs: 3.9 10*3/uL (ref 1.4–6.5)
Neutrophils Relative %: 59 %
Platelets: 127 10*3/uL — ABNORMAL LOW (ref 150–440)
RBC: 4.4 MIL/uL (ref 4.40–5.90)
RDW: 12.8 % (ref 11.5–14.5)
WBC: 6.7 10*3/uL (ref 3.8–10.6)

## 2018-02-15 MED ORDER — CYANOCOBALAMIN 1000 MCG/ML IJ SOLN
1000.0000 ug | Freq: Once | INTRAMUSCULAR | Status: AC
Start: 2018-02-15 — End: 2018-02-15
  Administered 2018-02-15: 1000 ug via INTRAMUSCULAR

## 2018-02-15 NOTE — Progress Notes (Signed)
[facility] day:  02/15/2018  Chief Complaint: Cristian Rogers is a 72 y.o. male with thrombocytopenia and B12 deficiency who is seen for 2 month assessment on oral B12.  HPI:  The patient was last seen in the hematology clinic on 12/14/2017.  At that time, he felt good.  He denied any bruising or bleeding.  Exam was unremarkable.  Platelet count was 145,000.  Work-up revealed B12 deficiency.  We discussed oral B12 versus B12 injections.  He wished to receive injections.  He received weekly B12 x 6 (12/14/2017 - 01/18/2018).  During the interim, patient notes that things are going "slowly but surely". He has appreciated an increase in his energy level with the parenteral B12 supplementation. Patient denies bleeding; no hematochezia, melena, or gross hematuria. He denies any areas of unexplained bruising. Patient denies that he has experienced any B symptoms. He denies any interval infections.   Patient advises that he maintains an adequate appetite. He is eating well. Weight today is 227 lb 11.8 oz (103.3 kg), which compared to his last visit to the clinic, represents a  2 pound decrease.   Patient denies pain in the clinic today.   Past Medical History:  Diagnosis Date  . Allergy   . Anemia   . Diabetes mellitus without complication [facility])     Past Surgical History:  Procedure Laterality Date  . INNER EAR SURGERY  1990    Family History  Problem Relation Age of Onset  . Cancer Mother   . Cancer Sister     Social History:  reports that he has never smoked. He has never used smokeless tobacco. He reports that he drank alcohol. He reports that he does not use drugs.  He drank as a teenager.  He denies any exposure to radiation or toxins.  He lives in [place].  His wife's name is Joycelyn Schmid.  The patient is alone today.  Allergies: No Known Allergies  Current Medications: Current Outpatient Medications  Medication Sig Dispense Refill   . aspirin 81 MG chewable tablet Chew by mouth.    Marland Kitchen atorvastatin (LIPITOR) 40 MG tablet Take by mouth.    . metFORMIN (GLUCOPHAGE-XR) 500 MG 24 hr tablet Take by mouth.    . MULTIPLE VITAMIN PO Take 1 tablet by mouth daily.    . Omega-3 Fatty Acids (FISH OIL) 1200 MG CAPS Take 1 capsule by mouth daily.     No current facility-administered medications for this visit.     Review of Systems  Constitutional: Positive for weight loss (down 2 pounds). Negative for diaphoresis, fever and malaise/fatigue.       "I'm feeling alright".   HENT: Negative.   Eyes: Negative.   Respiratory: Negative for cough, hemoptysis, sputum production and shortness of breath.   Cardiovascular: Negative for chest pain, palpitations, orthopnea, leg swelling and PND.  Gastrointestinal: Negative for abdominal pain, blood in stool, constipation, diarrhea, melena, nausea and vomiting.  Genitourinary: Negative for dysuria, frequency, hematuria and urgency.  Musculoskeletal: Negative for back pain, falls, joint pain and myalgias.  Skin: Negative for itching and rash.  Neurological: Negative for dizziness, tremors, weakness and headaches.  Endo/Heme/Allergies: Does not bruise/bleed easily.  Psychiatric/Behavioral: Negative for depression, memory loss and suicidal ideas. The patient is not nervous/anxious and does not have insomnia.   All other systems reviewed and are negative.  Performance status (ECOG): 1 - Symptomatic but completely ambulatory  Vital Signs BP 133/74 (BP Location: Left Arm, Patient  Position: Sitting)   Pulse 85   Temp (!) 97.3 F (36.3 C) (Tympanic)   Resp 18   Wt 227 lb 11.8 oz (103.3 kg)   Physical Exam  Constitutional: He is oriented to person, place, and time and well-developed, well-nourished, and in no distress.  HENT:  Head: Normocephalic and atraumatic.  White beard  Eyes: Pupils are equal, round, and reactive to light. EOM are normal. No scleral icterus.  Brown eyes  Neck: Normal  range of motion. Neck supple. No tracheal deviation present. No thyromegaly present.  Cardiovascular: Normal rate, regular rhythm and normal heart sounds. Exam reveals no gallop and no friction rub.  No murmur heard. Pulmonary/Chest: Effort normal and breath sounds normal. No respiratory distress. He has no wheezes. He has no rales.  Abdominal: Soft. Bowel sounds are normal. He exhibits no distension. There is no tenderness.  Musculoskeletal: Normal range of motion. He exhibits no edema or tenderness.  Neurological: He is alert and oriented to person, place, and time.  Skin: Skin is warm and dry. No rash noted. No erythema.  Psychiatric: Mood, affect and judgment normal.  Nursing note and vitals reviewed.   Appointment on 02/15/2018  Component Date Value Ref Range Status  . Sodium 02/15/2018 141  135 - 145 mmol/L Final  . Potassium 02/15/2018 4.2  3.5 - 5.1 mmol/L Final  . Chloride 02/15/2018 105  98 - 111 mmol/L Final  . CO2 02/15/2018 24  22 - 32 mmol/L Final  . Glucose, Bld 02/15/2018 168* 70 - 99 mg/dL Final  . BUN 02/15/2018 17  8 - 23 mg/dL Final  . Creatinine, Ser 02/15/2018 1.23  0.61 - 1.24 mg/dL Final  . Calcium 02/15/2018 9.3  8.9 - 10.3 mg/dL Final  . GFR calc non Af Amer 02/15/2018 58* >60 mL/min Final  . GFR calc Af Amer 02/15/2018 >60  >60 mL/min Final   Comment: (NOTE) The eGFR has been calculated using the CKD EPI equation. This calculation has not been validated in all clinical situations. eGFR's persistently <60 mL/min signify possible Chronic Kidney Disease.   Georgiann Hahn gap 02/15/2018 12  5 - 15 Final   Performed at [facility], [address] [place], [place] [address]  . WBC 02/15/2018 6.7  3.8 - 10.6 K/uL Final  . RBC 02/15/2018 4.40  4.40 - 5.90 MIL/uL Final  . Hemoglobin 02/15/2018 13.9  13.0 - 18.0 g/dL Final  . HCT 02/15/2018 40.4  40.0 - 52.0 % Final  . MCV 02/15/2018 91.7  80.0 - 100.0 fL Final  . MCH 02/15/2018 31.6  26.0 - 34.0 pg Final   . MCHC 02/15/2018 34.4  32.0 - 36.0 g/dL Final  . RDW 02/15/2018 12.8  11.5 - 14.5 % Final  . Platelets 02/15/2018 127* 150 - 440 K/uL Final  . Neutrophils Relative % 02/15/2018 59  % Final  . Neutro Abs 02/15/2018 3.9  1.4 - 6.5 K/uL Final  . Lymphocytes Relative 02/15/2018 30  % Final  . Lymphs Abs 02/15/2018 2.0  1.0 - 3.6 K/uL Final  . Monocytes Relative 02/15/2018 7  % Final  . Monocytes Absolute 02/15/2018 0.5  0.2 - 1.0 K/uL Final  . Eosinophils Relative 02/15/2018 4  % Final  . Eosinophils Absolute 02/15/2018 0.2  0 - 0.7 K/uL Final  . Basophils Relative 02/15/2018 0  % Final  . Basophils Absolute 02/15/2018 0.0  0 - 0.1 K/uL Final   Performed at [facility], [address],  [place], [place] [address]    Assessment:  Cristian Rogers is a 72 y.o. male with mild thrombocytopenia and B12 deficiency.  He denies any new medications or herbal products except for saw palmetto and Lipitor (incidence of thrombocytopenia < 1%).  He denies any alcohol use.  Diet is fair.   Work-up on 12/07/2017 revealed revealed a hematocrit of 38.4, hemoglobin 13.5, MCV 91.2, platelets 145,000, WBC 7100 with an ANC of 4000.  Differential was unremarkable.  Normal studies included:  ANA, hepatitis B core antibody total, hepatitis B surface antigen, hepatitis C antibody, HIV testing, SPEP, and free light chains.  PT and PTT were normal.  Ferritin was 157.  Iron saturation was 24% with a TIBC of 299.  Folate was 12.9.  Retic was 1.7%.  B12 was 201 (low).  He has a mild normocytic anemia.  Last colonoscopy was < 10 years ago.  He has B12 deficiency.  He received weekly B12 x 6 (12/14/2017 - 01/18/2018).  He has a family history of malignancy.  His father died of prostate cancer.  His mother and sister died of breast cancer.  PSA was 1.75.    Symptomatically, patient is well.  He denies any acute physical complaints. Patient denies bleeding; no hematochezia, melena, or gross hematuria.  Exam is  grossly unremarkable.  Plan: 1. Labs today:  CBC with diff, BMP, antiparietal antibody, intrinsic factor antibody. 2. Thrombocytopenia - stable  Platelet count stable today at 127,000.  Patient denies any bruising or bleeding.  We will continue to monitor.  Discussed need for ultrasound imaging of the abdomen to assess hepatic and splenic size due to ongoing thrombocytopenia.    Patient is in agreement with proposed plan of care.  Will order ultrasound as discussed. 3. B12 deficiency -stable  CBC has improved.  There is no macrocytosis.    Patient has completed parenteral B12 supplementation weekly x6.  Will receive first monthly injection today.  Continue as previously ordered.  RTC monthly x5 for B12 injections. 4. RTC in 6 months for MD assessment and labs (CBC with differential, folate).   Honor Loh 02/15/2018, 2:54 PM  I saw and evaluated the patient, participating in the key portions of the service and reviewing pertinent diagnostic studies and records.  I reviewed the nurse practitioner's note and agree with the findings and the plan.  The assessment and plan were discussed with the patient.  Several questions were asked by the patient and answered.   Nolon Stalls, MD 02/15/2018,2:54 PM

## 2018-02-15 NOTE — Patient Instructions (Signed)
Cyanocobalamin, Vitamin B12 injection What is this medicine? CYANOCOBALAMIN (sye an oh koe BAL a min) is a man made form of vitamin B12. Vitamin B12 is used in the growth of healthy blood cells, nerve cells, and proteins in the body. It also helps with the metabolism of fats and carbohydrates. This medicine is used to treat people who can not absorb vitamin B12. This medicine may be used for other purposes; ask your health care provider or pharmacist if you have questions. COMMON BRAND NAME(S): B-12 Compliance Kit, B-12 Injection Kit, Cyomin, LA-12, Nutri-Twelve, Physicians EZ Use B-12, Primabalt What should I tell my health care provider before I take this medicine? They need to know if you have any of these conditions: -kidney disease -Leber's disease -megaloblastic anemia -an unusual or allergic reaction to cyanocobalamin, cobalt, other medicines, foods, dyes, or preservatives -pregnant or trying to get pregnant -breast-feeding How should I use this medicine? This medicine is injected into a muscle or deeply under the skin. It is usually given by a health care professional in a clinic or doctor's office. However, your doctor may teach you how to inject yourself. Follow all instructions. Talk to your pediatrician regarding the use of this medicine in children. Special care may be needed. Overdosage: If you think you have taken too much of this medicine contact a poison control center or emergency room at once. NOTE: This medicine is only for you. Do not share this medicine with others. What if I miss a dose? If you are given your dose at a clinic or doctor's office, call to reschedule your appointment. If you give your own injections and you miss a dose, take it as soon as you can. If it is almost time for your next dose, take only that dose. Do not take double or extra doses. What may interact with this medicine? -colchicine -heavy alcohol intake This list may not describe all possible  interactions. Give your health care provider a list of all the medicines, herbs, non-prescription drugs, or dietary supplements you use. Also tell them if you smoke, drink alcohol, or use illegal drugs. Some items may interact with your medicine. What should I watch for while using this medicine? Visit your doctor or health care professional regularly. You may need blood work done while you are taking this medicine. You may need to follow a special diet. Talk to your doctor. Limit your alcohol intake and avoid smoking to get the best benefit. What side effects may I notice from receiving this medicine? Side effects that you should report to your doctor or health care professional as soon as possible: -allergic reactions like skin rash, itching or hives, swelling of the face, lips, or tongue -blue tint to skin -chest tightness, pain -difficulty breathing, wheezing -dizziness -red, swollen painful area on the leg Side effects that usually do not require medical attention (report to your doctor or health care professional if they continue or are bothersome): -diarrhea -headache This list may not describe all possible side effects. Call your doctor for medical advice about side effects. You may report side effects to FDA at 1-800-FDA-1088. Where should I keep my medicine? Keep out of the reach of children. Store at room temperature between 15 and 30 degrees C (59 and 85 degrees F). Protect from light. Throw away any unused medicine after the expiration date. NOTE: This sheet is a summary. It may not cover all possible information. If you have questions about this medicine, talk to your doctor, pharmacist, or   health care provider.  2018 Elsevier/Gold Standard (2007-10-09 22:10:20)  

## 2018-02-15 NOTE — Progress Notes (Signed)
Patient offers no complaints today. 

## 2018-02-16 LAB — ANTI-PARIETAL ANTIBODY: Parietal Cell Antibody-IgG: 1.6 Units (ref 0.0–20.0)

## 2018-02-16 LAB — INTRINSIC FACTOR ANTIBODIES: Intrinsic Factor: 0.8 AU/mL (ref 0.0–1.1)

## 2018-02-20 ENCOUNTER — Ambulatory Visit
Admission: RE | Admit: 2018-02-20 | Discharge: 2018-02-20 | Disposition: A | Discharge status: Home / Self Care | Payer: Medicare Other | Source: Ambulatory Visit | Attending: Urgent Care | Admitting: Urgent Care

## 2018-02-20 DIAGNOSIS — E538 Deficiency of other specified B group vitamins: Secondary | ICD-10-CM | POA: Insufficient documentation

## 2018-02-20 DIAGNOSIS — D696 Thrombocytopenia, unspecified: Secondary | ICD-10-CM | POA: Diagnosis not present

## 2018-03-15 ENCOUNTER — Inpatient Hospital Stay: Payer: Medicare Other | Attending: Hematology and Oncology

## 2018-03-15 VITALS — BP 124/69 | HR 80 | Temp 97.2°F | Resp 18

## 2018-03-15 DIAGNOSIS — E538 Deficiency of other specified B group vitamins: Secondary | ICD-10-CM | POA: Insufficient documentation

## 2018-03-15 MED ORDER — CYANOCOBALAMIN 1000 MCG/ML IJ SOLN
1000.0000 ug | Freq: Once | INTRAMUSCULAR | Status: AC
  Administered 2018-03-15: 1000 ug via INTRAMUSCULAR

## 2018-04-12 ENCOUNTER — Inpatient Hospital Stay: Payer: Medicare Other | Attending: Hematology and Oncology

## 2018-04-12 VITALS — BP 157/88 | HR 91 | Temp 98.2°F | Resp 18

## 2018-04-12 DIAGNOSIS — E538 Deficiency of other specified B group vitamins: Secondary | ICD-10-CM | POA: Insufficient documentation

## 2018-04-12 DIAGNOSIS — Z79899 Other long term (current) drug therapy: Secondary | ICD-10-CM | POA: Diagnosis not present

## 2018-04-12 DIAGNOSIS — D649 Anemia, unspecified: Secondary | ICD-10-CM | POA: Insufficient documentation

## 2018-04-12 DIAGNOSIS — Z23 Encounter for immunization: Secondary | ICD-10-CM | POA: Diagnosis not present

## 2018-04-12 DIAGNOSIS — D696 Thrombocytopenia, unspecified: Secondary | ICD-10-CM | POA: Insufficient documentation

## 2018-04-12 MED ORDER — INFLUENZA VAC SPLIT HIGH-DOSE 0.5 ML IM SUSY
0.5000 mL | PREFILLED_SYRINGE | INTRAMUSCULAR | Status: AC
  Administered 2018-04-12: 0.5 mL via INTRAMUSCULAR

## 2018-04-12 MED ORDER — CYANOCOBALAMIN 1000 MCG/ML IJ SOLN
1000.0000 ug | Freq: Once | INTRAMUSCULAR | Status: AC
  Administered 2018-04-12: 1000 ug via INTRAMUSCULAR
  Filled 2018-04-12: qty 1

## 2018-04-12 NOTE — Patient Instructions (Signed)
Cyanocobalamin, Vitamin B12 injection What is this medicine? CYANOCOBALAMIN (sye an oh koe BAL a min) is a man made form of vitamin B12. Vitamin B12 is used in the growth of healthy blood cells, nerve cells, and proteins in the body. It also helps with the metabolism of fats and carbohydrates. This medicine is used to treat people who can not absorb vitamin B12. This medicine may be used for other purposes; ask your health care provider or pharmacist if you have questions. COMMON BRAND NAME(S): B-12 Compliance Kit, B-12 Injection Kit, Cyomin, LA-12, Nutri-Twelve, Physicians EZ Use B-12, Primabalt What should I tell my health care provider before I take this medicine? They need to know if you have any of these conditions: -kidney disease -Leber's disease -megaloblastic anemia -an unusual or allergic reaction to cyanocobalamin, cobalt, other medicines, foods, dyes, or preservatives -pregnant or trying to get pregnant -breast-feeding How should I use this medicine? This medicine is injected into a muscle or deeply under the skin. It is usually given by a health care professional in a clinic or doctor's office. However, your doctor may teach you how to inject yourself. Follow all instructions. Talk to your pediatrician regarding the use of this medicine in children. Special care may be needed. Overdosage: If you think you have taken too much of this medicine contact a poison control center or emergency room at once. NOTE: This medicine is only for you. Do not share this medicine with others. What if I miss a dose? If you are given your dose at a clinic or doctor's office, call to reschedule your appointment. If you give your own injections and you miss a dose, take it as soon as you can. If it is almost time for your next dose, take only that dose. Do not take double or extra doses. What may interact with this medicine? -colchicine -heavy alcohol intake This list may not describe all possible  interactions. Give your health care provider a list of all the medicines, herbs, non-prescription drugs, or dietary supplements you use. Also tell them if you smoke, drink alcohol, or use illegal drugs. Some items may interact with your medicine. What should I watch for while using this medicine? Visit your doctor or health care professional regularly. You may need blood work done while you are taking this medicine. You may need to follow a special diet. Talk to your doctor. Limit your alcohol intake and avoid smoking to get the best benefit. What side effects may I notice from receiving this medicine? Side effects that you should report to your doctor or health care professional as soon as possible: -allergic reactions like skin rash, itching or hives, swelling of the face, lips, or tongue -blue tint to skin -chest tightness, pain -difficulty breathing, wheezing -dizziness -red, swollen painful area on the leg Side effects that usually do not require medical attention (report to your doctor or health care professional if they continue or are bothersome): -diarrhea -headache This list may not describe all possible side effects. Call your doctor for medical advice about side effects. You may report side effects to FDA at 1-800-FDA-1088. Where should I keep my medicine? Keep out of the reach of children. Store at room temperature between 15 and 30 degrees C (59 and 85 degrees F). Protect from light. Throw away any unused medicine after the expiration date. NOTE: This sheet is a summary. It may not cover all possible information. If you have questions about this medicine, talk to your doctor, pharmacist, or   health care provider.  2018 Elsevier/Gold Standard (2007-10-09 22:10:20) Influenza Virus Vaccine injection What is this medicine? INFLUENZA VIRUS VACCINE (in floo EN zuh VAHY ruhs vak SEEN) helps to reduce the risk of getting influenza also known as the flu. The vaccine only helps protect you  against some strains of the flu. This medicine may be used for other purposes; ask your health care provider or pharmacist if you have questions. COMMON BRAND NAME(S): Afluria, Agriflu, Alfuria, FLUAD, Fluarix, Fluarix Quadrivalent, Flublok, Flublok Quadrivalent, FLUCELVAX, Flulaval, Fluvirin, Fluzone, Fluzone High-Dose, Fluzone Intradermal What should I tell my health care provider before I take this medicine? They need to know if you have any of these conditions: -bleeding disorder like hemophilia -fever or infection -Guillain-Barre syndrome or other neurological problems -immune system problems -infection with the human immunodeficiency virus (HIV) or AIDS -low blood platelet counts -multiple sclerosis -an unusual or allergic reaction to influenza virus vaccine, latex, other medicines, foods, dyes, or preservatives. Different brands of vaccines contain different allergens. Some may contain latex or eggs. Talk to your doctor about your allergies to make sure that you get the right vaccine. -pregnant or trying to get pregnant -breast-feeding How should I use this medicine? This vaccine is for injection into a muscle or under the skin. It is given by a health care professional. A copy of Vaccine Information Statements will be given before each vaccination. Read this sheet carefully each time. The sheet may change frequently. Talk to your healthcare provider to see which vaccines are right for you. Some vaccines should not be used in all age groups. Overdosage: If you think you have taken too much of this medicine contact a poison control center or emergency room at once. NOTE: This medicine is only for you. Do not share this medicine with others. What if I miss a dose? This does not apply. What may interact with this medicine? -chemotherapy or radiation therapy -medicines that lower your immune system like etanercept, anakinra, infliximab, and adalimumab -medicines that treat or prevent  blood clots like warfarin -phenytoin -steroid medicines like prednisone or cortisone -theophylline -vaccines This list may not describe all possible interactions. Give your health care provider a list of all the medicines, herbs, non-prescription drugs, or dietary supplements you use. Also tell them if you smoke, drink alcohol, or use illegal drugs. Some items may interact with your medicine. What should I watch for while using this medicine? Report any side effects that do not go away within 3 days to your doctor or health care professional. Call your health care provider if any unusual symptoms occur within 6 weeks of receiving this vaccine. You may still catch the flu, but the illness is not usually as bad. You cannot get the flu from the vaccine. The vaccine will not protect against colds or other illnesses that may cause fever. The vaccine is needed every year. What side effects may I notice from receiving this medicine? Side effects that you should report to your doctor or health care professional as soon as possible: -allergic reactions like skin rash, itching or hives, swelling of the face, lips, or tongue Side effects that usually do not require medical attention (report to your doctor or health care professional if they continue or are bothersome): -fever -headache -muscle aches and pains -pain, tenderness, redness, or swelling at the injection site -tiredness This list may not describe all possible side effects. Call your doctor for medical advice about side effects. You may report side effects to FDA at  1-800-FDA-1088. Where should I keep my medicine? The vaccine will be given by a health care professional in a clinic, pharmacy, doctor's office, or other health care setting. You will not be given vaccine doses to store at home. NOTE: This sheet is a summary. It may not cover all possible information. If you have questions about this medicine, talk to your doctor, pharmacist, or  health care provider.  2018 Elsevier/Gold Standard (2015-01-17 10:07:28)

## 2018-05-10 ENCOUNTER — Inpatient Hospital Stay: Payer: Medicare Other

## 2018-05-10 VITALS — BP 128/73 | HR 75 | Temp 97.7°F | Resp 18

## 2018-05-10 DIAGNOSIS — E538 Deficiency of other specified B group vitamins: Secondary | ICD-10-CM

## 2018-05-10 MED ORDER — CYANOCOBALAMIN 1000 MCG/ML IJ SOLN
1000.0000 ug | Freq: Once | INTRAMUSCULAR | Status: AC
  Administered 2018-05-10: 1000 ug via INTRAMUSCULAR

## 2018-05-10 NOTE — Patient Instructions (Signed)
Cyanocobalamin, Vitamin B12 injection What is this medicine? CYANOCOBALAMIN (sye an oh koe BAL a min) is a man made form of vitamin B12. Vitamin B12 is used in the growth of healthy blood cells, nerve cells, and proteins in the body. It also helps with the metabolism of fats and carbohydrates. This medicine is used to treat people who can not absorb vitamin B12. This medicine may be used for other purposes; ask your health care provider or pharmacist if you have questions. COMMON BRAND NAME(S): B-12 Compliance Kit, B-12 Injection Kit, Cyomin, LA-12, Nutri-Twelve, Physicians EZ Use B-12, Primabalt What should I tell my health care provider before I take this medicine? They need to know if you have any of these conditions: -kidney disease -Leber's disease -megaloblastic anemia -an unusual or allergic reaction to cyanocobalamin, cobalt, other medicines, foods, dyes, or preservatives -pregnant or trying to get pregnant -breast-feeding How should I use this medicine? This medicine is injected into a muscle or deeply under the skin. It is usually given by a health care professional in a clinic or doctor's office. However, your doctor may teach you how to inject yourself. Follow all instructions. Talk to your pediatrician regarding the use of this medicine in children. Special care may be needed. Overdosage: If you think you have taken too much of this medicine contact a poison control center or emergency room at once. NOTE: This medicine is only for you. Do not share this medicine with others. What if I miss a dose? If you are given your dose at a clinic or doctor's office, call to reschedule your appointment. If you give your own injections and you miss a dose, take it as soon as you can. If it is almost time for your next dose, take only that dose. Do not take double or extra doses. What may interact with this medicine? -colchicine -heavy alcohol intake This list may not describe all possible  interactions. Give your health care provider a list of all the medicines, herbs, non-prescription drugs, or dietary supplements you use. Also tell them if you smoke, drink alcohol, or use illegal drugs. Some items may interact with your medicine. What should I watch for while using this medicine? Visit your doctor or health care professional regularly. You may need blood work done while you are taking this medicine. You may need to follow a special diet. Talk to your doctor. Limit your alcohol intake and avoid smoking to get the best benefit. What side effects may I notice from receiving this medicine? Side effects that you should report to your doctor or health care professional as soon as possible: -allergic reactions like skin rash, itching or hives, swelling of the face, lips, or tongue -blue tint to skin -chest tightness, pain -difficulty breathing, wheezing -dizziness -red, swollen painful area on the leg Side effects that usually do not require medical attention (report to your doctor or health care professional if they continue or are bothersome): -diarrhea -headache This list may not describe all possible side effects. Call your doctor for medical advice about side effects. You may report side effects to FDA at 1-800-FDA-1088. Where should I keep my medicine? Keep out of the reach of children. Store at room temperature between 15 and 30 degrees C (59 and 85 degrees F). Protect from light. Throw away any unused medicine after the expiration date. NOTE: This sheet is a summary. It may not cover all possible information. If you have questions about this medicine, talk to your doctor, pharmacist, or  health care provider.  2018 Elsevier/Gold Standard (2007-10-09 22:10:20)

## 2018-06-07 ENCOUNTER — Inpatient Hospital Stay: Payer: Medicare Other | Attending: Hematology and Oncology

## 2018-06-07 VITALS — BP 143/78 | HR 77 | Temp 97.4°F | Resp 18

## 2018-06-07 DIAGNOSIS — E538 Deficiency of other specified B group vitamins: Secondary | ICD-10-CM | POA: Diagnosis not present

## 2018-06-07 DIAGNOSIS — Z79899 Other long term (current) drug therapy: Secondary | ICD-10-CM | POA: Insufficient documentation

## 2018-06-07 MED ORDER — CYANOCOBALAMIN 1000 MCG/ML IJ SOLN
1000.0000 ug | Freq: Once | INTRAMUSCULAR | Status: AC
  Administered 2018-06-07: 1000 ug via INTRAMUSCULAR

## 2018-06-07 NOTE — Patient Instructions (Signed)
Cyanocobalamin, Vitamin B12 injection What is this medicine? CYANOCOBALAMIN (sye an oh koe BAL a min) is a man made form of vitamin B12. Vitamin B12 is used in the growth of healthy blood cells, nerve cells, and proteins in the body. It also helps with the metabolism of fats and carbohydrates. This medicine is used to treat people who can not absorb vitamin B12. This medicine may be used for other purposes; ask your health care provider or pharmacist if you have questions. COMMON BRAND NAME(S): B-12 Compliance Kit, B-12 Injection Kit, Cyomin, LA-12, Nutri-Twelve, Physicians EZ Use B-12, Primabalt What should I tell my health care provider before I take this medicine? They need to know if you have any of these conditions: -kidney disease -Leber's disease -megaloblastic anemia -an unusual or allergic reaction to cyanocobalamin, cobalt, other medicines, foods, dyes, or preservatives -pregnant or trying to get pregnant -breast-feeding How should I use this medicine? This medicine is injected into a muscle or deeply under the skin. It is usually given by a health care professional in a clinic or doctor's office. However, your doctor may teach you how to inject yourself. Follow all instructions. Talk to your pediatrician regarding the use of this medicine in children. Special care may be needed. Overdosage: If you think you have taken too much of this medicine contact a poison control center or emergency room at once. NOTE: This medicine is only for you. Do not share this medicine with others. What if I miss a dose? If you are given your dose at a clinic or doctor's office, call to reschedule your appointment. If you give your own injections and you miss a dose, take it as soon as you can. If it is almost time for your next dose, take only that dose. Do not take double or extra doses. What may interact with this medicine? -colchicine -heavy alcohol intake This list may not describe all possible  interactions. Give your health care provider a list of all the medicines, herbs, non-prescription drugs, or dietary supplements you use. Also tell them if you smoke, drink alcohol, or use illegal drugs. Some items may interact with your medicine. What should I watch for while using this medicine? Visit your doctor or health care professional regularly. You may need blood work done while you are taking this medicine. You may need to follow a special diet. Talk to your doctor. Limit your alcohol intake and avoid smoking to get the best benefit. What side effects may I notice from receiving this medicine? Side effects that you should report to your doctor or health care professional as soon as possible: -allergic reactions like skin rash, itching or hives, swelling of the face, lips, or tongue -blue tint to skin -chest tightness, pain -difficulty breathing, wheezing -dizziness -red, swollen painful area on the leg Side effects that usually do not require medical attention (report to your doctor or health care professional if they continue or are bothersome): -diarrhea -headache This list may not describe all possible side effects. Call your doctor for medical advice about side effects. You may report side effects to FDA at 1-800-FDA-1088. Where should I keep my medicine? Keep out of the reach of children. Store at room temperature between 15 and 30 degrees C (59 and 85 degrees F). Protect from light. Throw away any unused medicine after the expiration date. NOTE: This sheet is a summary. It may not cover all possible information. If you have questions about this medicine, talk to your doctor, pharmacist, or   health care provider.  2018 Elsevier/Gold Standard (2007-10-09 22:10:20)  

## 2018-07-06 ENCOUNTER — Inpatient Hospital Stay: Payer: Medicare Other | Attending: Hematology and Oncology

## 2018-07-06 VITALS — BP 162/97 | HR 83 | Temp 97.7°F | Resp 16

## 2018-07-06 DIAGNOSIS — D649 Anemia, unspecified: Secondary | ICD-10-CM | POA: Diagnosis not present

## 2018-07-06 DIAGNOSIS — Z79899 Other long term (current) drug therapy: Secondary | ICD-10-CM | POA: Diagnosis not present

## 2018-07-06 DIAGNOSIS — E538 Deficiency of other specified B group vitamins: Secondary | ICD-10-CM | POA: Diagnosis not present

## 2018-07-06 DIAGNOSIS — D696 Thrombocytopenia, unspecified: Secondary | ICD-10-CM | POA: Insufficient documentation

## 2018-07-06 MED ORDER — CYANOCOBALAMIN 1000 MCG/ML IJ SOLN
1000.0000 ug | Freq: Once | INTRAMUSCULAR | Status: AC
  Administered 2018-07-06: 1000 ug via INTRAMUSCULAR
  Filled 2018-07-06: qty 1

## 2018-07-06 NOTE — Patient Instructions (Signed)
Cyanocobalamin, Vitamin B12 injection What is this medicine? CYANOCOBALAMIN (sye an oh koe BAL a min) is a man made form of vitamin B12. Vitamin B12 is used in the growth of healthy blood cells, nerve cells, and proteins in the body. It also helps with the metabolism of fats and carbohydrates. This medicine is used to treat people who can not absorb vitamin B12. This medicine may be used for other purposes; ask your health care provider or pharmacist if you have questions. COMMON BRAND NAME(S): B-12 Compliance Kit, B-12 Injection Kit, Cyomin, LA-12, Nutri-Twelve, Physicians EZ Use B-12, Primabalt What should I tell my health care provider before I take this medicine? They need to know if you have any of these conditions: -kidney disease -Leber's disease -megaloblastic anemia -an unusual or allergic reaction to cyanocobalamin, cobalt, other medicines, foods, dyes, or preservatives -pregnant or trying to get pregnant -breast-feeding How should I use this medicine? This medicine is injected into a muscle or deeply under the skin. It is usually given by a health care professional in a clinic or doctor's office. However, your doctor may teach you how to inject yourself. Follow all instructions. Talk to your pediatrician regarding the use of this medicine in children. Special care may be needed. Overdosage: If you think you have taken too much of this medicine contact a poison control center or emergency room at once. NOTE: This medicine is only for you. Do not share this medicine with others. What if I miss a dose? If you are given your dose at a clinic or doctor's office, call to reschedule your appointment. If you give your own injections and you miss a dose, take it as soon as you can. If it is almost time for your next dose, take only that dose. Do not take double or extra doses. What may interact with this medicine? -colchicine -heavy alcohol intake This list may not describe all possible  interactions. Give your health care provider a list of all the medicines, herbs, non-prescription drugs, or dietary supplements you use. Also tell them if you smoke, drink alcohol, or use illegal drugs. Some items may interact with your medicine. What should I watch for while using this medicine? Visit your doctor or health care professional regularly. You may need blood work done while you are taking this medicine. You may need to follow a special diet. Talk to your doctor. Limit your alcohol intake and avoid smoking to get the best benefit. What side effects may I notice from receiving this medicine? Side effects that you should report to your doctor or health care professional as soon as possible: -allergic reactions like skin rash, itching or hives, swelling of the face, lips, or tongue -blue tint to skin -chest tightness, pain -difficulty breathing, wheezing -dizziness -red, swollen painful area on the leg Side effects that usually do not require medical attention (report to your doctor or health care professional if they continue or are bothersome): -diarrhea -headache This list may not describe all possible side effects. Call your doctor for medical advice about side effects. You may report side effects to FDA at 1-800-FDA-1088. Where should I keep my medicine? Keep out of the reach of children. Store at room temperature between 15 and 30 degrees C (59 and 85 degrees F). Protect from light. Throw away any unused medicine after the expiration date. NOTE: This sheet is a summary. It may not cover all possible information. If you have questions about this medicine, talk to your doctor, pharmacist, or   health care provider.  2019 Elsevier/Gold Standard (2007-10-09 22:10:20)  

## 2018-08-02 ENCOUNTER — Inpatient Hospital Stay: Payer: Medicare Other

## 2018-08-02 ENCOUNTER — Inpatient Hospital Stay: Payer: Medicare Other | Attending: Hematology and Oncology | Admitting: Hematology and Oncology

## 2018-08-02 ENCOUNTER — Encounter: Payer: Self-pay | Admitting: Hematology and Oncology

## 2018-08-02 VITALS — BP 122/71 | HR 84 | Temp 98.0°F | Resp 18 | Wt 230.9 lb

## 2018-08-02 DIAGNOSIS — D696 Thrombocytopenia, unspecified: Secondary | ICD-10-CM | POA: Diagnosis not present

## 2018-08-02 DIAGNOSIS — E538 Deficiency of other specified B group vitamins: Secondary | ICD-10-CM

## 2018-08-02 LAB — CBC WITH DIFFERENTIAL/PLATELET
Abs Immature Granulocytes: 0.04 10*3/uL (ref 0.00–0.07)
Basophils Absolute: 0 10*3/uL (ref 0.0–0.1)
Basophils Relative: 0 %
Eosinophils Absolute: 0.3 10*3/uL (ref 0.0–0.5)
Eosinophils Relative: 4 %
HCT: 39.6 % (ref 39.0–52.0)
Hemoglobin: 13.9 g/dL (ref 13.0–17.0)
Immature Granulocytes: 1 %
Lymphocytes Relative: 37 %
Lymphs Abs: 3.1 10*3/uL (ref 0.7–4.0)
MCH: 32.4 pg (ref 26.0–34.0)
MCHC: 35.1 g/dL (ref 30.0–36.0)
MCV: 92.3 fL (ref 80.0–100.0)
Monocytes Absolute: 0.9 10*3/uL (ref 0.1–1.0)
Monocytes Relative: 10 %
Neutro Abs: 4.1 10*3/uL (ref 1.7–7.7)
Neutrophils Relative %: 48 %
Platelets: 129 10*3/uL — ABNORMAL LOW (ref 150–400)
RBC: 4.29 MIL/uL (ref 4.22–5.81)
RDW: 12.8 % (ref 11.5–15.5)
WBC: 8.4 10*3/uL (ref 4.0–10.5)
nRBC: 0 % (ref 0.0–0.2)

## 2018-08-02 LAB — FOLATE: Folate: 36 ng/mL (ref >5.9)

## 2018-08-02 MED ORDER — CYANOCOBALAMIN 1000 MCG/ML IJ SOLN
1000.0000 ug | Freq: Once | INTRAMUSCULAR | Status: AC
  Administered 2018-08-02: 1000 ug via INTRAMUSCULAR
  Filled 2018-08-02: qty 1

## 2018-08-02 NOTE — Progress Notes (Signed)
[facility]  Clinic day:  08/02/2018  Chief Complaint: Cristian Rogers is a 73 y.o. male with thrombocytopenia and B12 deficiency who is seen for 5 month assessment on oral B12.  HPI:  The patient was last seen in the hematology clinic on 02/15/2018.  At that time, he felt well.  He denied any acute physical complaints. Patient denied bleeding; no hematochezia, melena, or gross hematuria.  Exam was grossly unremarkable.  Intrinsic factor and anti-parietal antibodies were negative.  He has received B12 monthly (02/15/2018 - 07/06/2018).  Patient had abdominal ultrasound on 02/20/2018 that revealed a normal spleen and hepatic steatosis.   During the interim, patient is doing well. He has chronic numbness in his BILATERAL lower extremities s/p a fall on black ice in 2009. Patient is scheduled for repeat EMG testing soon. He denies any acute concerns. Patient denies bleeding; no hematochezia, melena, or gross hematuria. No bruising or petechial rashes appreciated. Patient denies that he has experienced any fevers. He has had sweating, mainly at night, which has been going on for "awhile".  He denies any interval infections.   Patient denies alcohol use. He has not began any new medications or herbal preparations.   Patient advises that he maintains an adequate appetite. He is eating well. Weight today is 230 lb 14.9 oz (104.8 kg), which compared to his last visit to the clinic, represents a 3 pound increase.   Patient denies pain in the clinic today.   Past Medical History:  Diagnosis Date  . Allergy   . Anemia   . Diabetes mellitus without complication [facility])     Past Surgical History:  Procedure Laterality Date  . INNER EAR SURGERY  1990    Family History  Problem Relation Age of Onset  . Cancer Mother   . Cancer Sister     Social History:  reports that he has never smoked. He has never used smokeless tobacco. He reports previous alcohol use. He  reports that he does not use drugs.  He drank as a teenager.  He denies any exposure to radiation or toxins.  He lives in [place].  His wife's name is Claris Che.  The patient is alone today.  Allergies: No Known Allergies  Current Medications: Current Outpatient Medications  Medication Sig Dispense Refill  . aspirin 81 MG chewable tablet Chew by mouth.    Marland Kitchen atorvastatin (LIPITOR) 40 MG tablet Take by mouth.    . metFORMIN (GLUCOPHAGE-XR) 500 MG 24 hr tablet Take by mouth.    . MULTIPLE VITAMIN PO Take 1 tablet by mouth daily.    . Omega-3 Fatty Acids (FISH OIL) 1200 MG CAPS Take 1 capsule by mouth daily.     No current facility-administered medications for this visit.     Review of Systems  Constitutional: Negative for chills, diaphoresis, fever, malaise/fatigue and weight loss (up 3 pounds).       Feels "ok".  HENT: Negative.  Negative for congestion, ear pain, nosebleeds, sinus pain and sore throat.   Eyes: Negative.  Negative for blurred vision, double vision, photophobia and pain.  Respiratory: Negative.  Negative for cough, hemoptysis, sputum production and shortness of breath.   Cardiovascular: Negative.  Negative for chest pain, palpitations, orthopnea, leg swelling and PND.  Gastrointestinal: Negative.  Negative for abdominal pain, blood in stool, constipation, diarrhea, melena, nausea and vomiting.  Genitourinary: Negative.  Negative for dysuria, frequency, hematuria and urgency.  Musculoskeletal: Negative.  Negative for back pain, falls,  joint pain, myalgias and neck pain.  Skin: Negative.  Negative for itching and rash.  Neurological: Negative.  Negative for dizziness, tremors, sensory change (in legs since fell on black ice in 2009), speech change, focal weakness, weakness and headaches.  Endo/Heme/Allergies: Negative.  Does not bruise/bleed easily.  Psychiatric/Behavioral: Negative.  Negative for depression and memory loss. The patient is not nervous/anxious and does not  have insomnia.   All other systems reviewed and are negative.  Performance status (ECOG): 1  Vital Signs BP 122/71 (BP Location: Left Arm, Patient Position: Sitting)   Pulse 84   Temp 98 F (36.7 C) (Tympanic)   Resp 18   Wt 230 lb 14.9 oz (104.8 kg)   SpO2 97%   Physical Exam  Constitutional: He is oriented to person, place, and time and well-developed, well-nourished, and in no distress. No distress.  HENT:  Head: Normocephalic and atraumatic.  Mouth/Throat: Oropharynx is clear and moist. No oropharyngeal exudate.  Short gray hair and beard.  Eyes: Pupils are equal, round, and reactive to light. Conjunctivae and EOM are normal. No scleral icterus.  Brown eyes  Neck: Neck supple. No JVD present.  Cardiovascular: Normal rate, regular rhythm and normal heart sounds. Exam reveals no gallop and no friction rub.  No murmur heard. Pulmonary/Chest: Effort normal and breath sounds normal. No respiratory distress. He has no wheezes. He has no rales.  Abdominal: Soft. Bowel sounds are normal. He exhibits no distension and no mass. There is no abdominal tenderness.  Musculoskeletal: Normal range of motion.        General: No tenderness or edema.  Lymphadenopathy:    He has no cervical adenopathy.  Neurological: He is alert and oriented to person, place, and time. Gait normal.  Skin: Skin is warm and dry. No rash noted. He is not diaphoretic. No pallor.  Psychiatric: Mood, affect and judgment normal.  Nursing note and vitals reviewed.   Appointment on 08/02/2018  Component Date Value Ref Range Status  . WBC 08/02/2018 8.4  4.0 - 10.5 K/uL Final  . RBC 08/02/2018 4.29  4.22 - 5.81 MIL/uL Final  . Hemoglobin 08/02/2018 13.9  13.0 - 17.0 g/dL Final  . HCT 04/54/098101/22/2020 39.6  39.0 - 52.0 % Final  . MCV 08/02/2018 92.3  80.0 - 100.0 fL Final  . MCH 08/02/2018 32.4  26.0 - 34.0 pg Final  . MCHC 08/02/2018 35.1  30.0 - 36.0 g/dL Final  . RDW 19/14/782901/22/2020 12.8  11.5 - 15.5 % Final  . Platelets  08/02/2018 129* 150 - 400 K/uL Final  . nRBC 08/02/2018 0.0  0.0 - 0.2 % Final  . Neutrophils Relative % 08/02/2018 48  % Final  . Neutro Abs 08/02/2018 4.1  1.7 - 7.7 K/uL Final  . Lymphocytes Relative 08/02/2018 37  % Final  . Lymphs Abs 08/02/2018 3.1  0.7 - 4.0 K/uL Final  . Monocytes Relative 08/02/2018 10  % Final  . Monocytes Absolute 08/02/2018 0.9  0.1 - 1.0 K/uL Final  . Eosinophils Relative 08/02/2018 4  % Final  . Eosinophils Absolute 08/02/2018 0.3  0.0 - 0.5 K/uL Final  . Basophils Relative 08/02/2018 0  % Final  . Basophils Absolute 08/02/2018 0.0  0.0 - 0.1 K/uL Final  . Immature Granulocytes 08/02/2018 1  % Final  . Abs Immature Granulocytes 08/02/2018 0.04  0.00 - 0.07 K/uL Final   Performed at [facility], [address] [place], [place] [address]    Assessment:  Cristian LongsJoseph  KYANDRE Rogers is a 73 y.o. male with mild thrombocytopenia and B12 deficiency.  He denies any new medications or herbal products except for saw palmetto and Lipitor (incidence of thrombocytopenia < 1%).  He denies any alcohol use.  Diet is fair.   Work-up on 12/07/2017 revealed revealed a hematocrit of 38.4, hemoglobin 13.5, MCV 91.2, platelets 145,000, WBC 7100 with an ANC of 4000.  Differential was unremarkable.  Normal studies included:  ANA, hepatitis B core antibody total, hepatitis B surface antigen, hepatitis C antibody, HIV testing, SPEP, and free light chains.  PT and PTT were normal.  Ferritin was 157.  Iron saturation was 24% with a TIBC of 299.  Folate was 12.9.  Retic was 1.7%.  B12 was 201 (low).    Abdominal ultrasound on 02/20/2018 revealed a normal spleen and hepatic steatosis.   He has a mild normocytic anemia.  Last colonoscopy was < 10 years ago.  He has B12 deficiency.  B12 was 201 on 12/07/2017.  He received weekly B12 x 6 (12/14/2017 - 01/18/2018) then monthly (last 07/07/2019).  Anti-pareital cell antibodies and intrinsic factor antibodies were negative on 02/15/2018.  He  has a family history of malignancy.  His father died of prostate cancer.  His mother and sister died of breast cancer.  PSA was 1.75.    Symptomatically, he is doing well.  He denies any bleeding.  Exam is unremarkable. Hemoglobin is 13.9.  Platelet count is 129,000.  Plan: 1. Labs today:  CBC with diff, folate. 2. Thrombocytopenia Platelet count is 129,000. Patient denies any bleeding. Spleen is normal size.   Discuss possible ITP, diagnosis of exclusion.  No indication for treatment.  Continue to monitor. 3. B12 deficiency B12 was 201 on 12/07/2017. Patient receiving parenteral B12 monthly. B12 today and monthly. Check folate today. 4. RTC in 6 months for MD assessment, labs (CBC with diff, CMP), and B12.    Quentin Mulling 08/02/2018, 2:47 PM  I saw and evaluated the patient, participating in the key portions of the service and reviewing pertinent diagnostic studies and records.  I reviewed the nurse practitioner's note and agree with the findings and the plan.  The assessment and plan were discussed with the patient.  Several questions were asked by the patient and answered.   Nelva Nay, MD 08/02/2018,2:47 PM

## 2018-08-02 NOTE — Progress Notes (Signed)
No new changes noted today 

## 2018-08-30 ENCOUNTER — Inpatient Hospital Stay: Payer: Medicare Other | Attending: Hematology and Oncology

## 2018-08-30 DIAGNOSIS — D696 Thrombocytopenia, unspecified: Secondary | ICD-10-CM | POA: Insufficient documentation

## 2018-08-30 DIAGNOSIS — E538 Deficiency of other specified B group vitamins: Secondary | ICD-10-CM | POA: Diagnosis not present

## 2018-08-30 DIAGNOSIS — Z79899 Other long term (current) drug therapy: Secondary | ICD-10-CM | POA: Insufficient documentation

## 2018-08-30 MED ORDER — CYANOCOBALAMIN 1000 MCG/ML IJ SOLN
1000.0000 ug | Freq: Once | INTRAMUSCULAR | Status: AC
  Administered 2018-08-30: 1000 ug via INTRAMUSCULAR

## 2018-09-27 ENCOUNTER — Other Ambulatory Visit: Payer: Self-pay

## 2018-09-27 ENCOUNTER — Inpatient Hospital Stay: Payer: Medicare Other | Attending: Hematology and Oncology

## 2018-09-27 VITALS — BP 111/70 | HR 80 | Temp 97.8°F | Resp 16

## 2018-09-27 DIAGNOSIS — Z79899 Other long term (current) drug therapy: Secondary | ICD-10-CM | POA: Diagnosis not present

## 2018-09-27 DIAGNOSIS — E538 Deficiency of other specified B group vitamins: Secondary | ICD-10-CM | POA: Diagnosis present

## 2018-09-27 MED ORDER — CYANOCOBALAMIN 1000 MCG/ML IJ SOLN
1000.0000 ug | Freq: Once | INTRAMUSCULAR | Status: AC
  Administered 2018-09-27: 1000 ug via INTRAMUSCULAR

## 2018-10-25 ENCOUNTER — Inpatient Hospital Stay: Payer: Medicare Other | Attending: Hematology and Oncology

## 2018-10-25 ENCOUNTER — Other Ambulatory Visit: Payer: Self-pay

## 2018-10-25 ENCOUNTER — Inpatient Hospital Stay: Payer: Medicare Other

## 2018-10-25 VITALS — BP 165/75 | HR 86 | Temp 98.5°F | Resp 18

## 2018-10-25 DIAGNOSIS — E538 Deficiency of other specified B group vitamins: Secondary | ICD-10-CM | POA: Diagnosis present

## 2018-10-25 DIAGNOSIS — Z79899 Other long term (current) drug therapy: Secondary | ICD-10-CM | POA: Insufficient documentation

## 2018-10-25 MED ORDER — CYANOCOBALAMIN 1000 MCG/ML IJ SOLN
1000.0000 ug | Freq: Once | INTRAMUSCULAR | Status: AC
  Administered 2018-10-25: 1000 ug via INTRAMUSCULAR

## 2018-11-21 ENCOUNTER — Other Ambulatory Visit: Payer: Self-pay

## 2018-11-22 ENCOUNTER — Inpatient Hospital Stay: Payer: Medicare Other | Attending: Hematology and Oncology

## 2018-11-22 VITALS — BP 134/78 | HR 77 | Temp 97.6°F | Resp 18

## 2018-11-22 DIAGNOSIS — Z79899 Other long term (current) drug therapy: Secondary | ICD-10-CM | POA: Insufficient documentation

## 2018-11-22 DIAGNOSIS — E538 Deficiency of other specified B group vitamins: Secondary | ICD-10-CM | POA: Diagnosis present

## 2018-11-22 MED ORDER — CYANOCOBALAMIN 1000 MCG/ML IJ SOLN
1000.0000 ug | Freq: Once | INTRAMUSCULAR | Status: AC
  Administered 2018-11-22: 14:00:00 1000 ug via INTRAMUSCULAR
  Filled 2018-11-22: qty 1

## 2018-12-20 ENCOUNTER — Inpatient Hospital Stay: Payer: Medicare Other | Attending: Hematology and Oncology

## 2018-12-20 ENCOUNTER — Other Ambulatory Visit: Payer: Self-pay

## 2018-12-20 VITALS — BP 143/82 | HR 73 | Temp 98.3°F | Resp 17

## 2018-12-20 DIAGNOSIS — E538 Deficiency of other specified B group vitamins: Secondary | ICD-10-CM | POA: Diagnosis not present

## 2018-12-20 MED ORDER — CYANOCOBALAMIN 1000 MCG/ML IJ SOLN
1000.0000 ug | Freq: Once | INTRAMUSCULAR | Status: AC
  Administered 2018-12-20: 1000 ug via INTRAMUSCULAR
  Filled 2018-12-20: qty 1

## 2019-01-15 NOTE — Progress Notes (Signed)
[facility]  [address], [address] [place], [place] [address] Phone: [phone]  Fax: [phone]   Clinic Day:  01/17/2019  Referring physician: Rolm GalaGrandis, Heidi, MD  Chief Complaint: Cristian BareJoseph W Rogers is a 73 y.o. male with thrombocytopenia and B12 deficiency who is seen for 5 month assessment on oral B12.  HPI: The patient was last seen in the hematology clinic on 08/02/2018. At that time, he was doing well.  He denied any bleeding. Exam was unremarkable. Hemoglobin 13.9. Platelet count 129,000.  He saw his PCP Doristine MangoElizabeth White, NP at [facility] on 11/27/2018. Labs showed: WBC 6,900, hemoglobin 13.1, hematocrit 37.6, platelets 135,000. Creatinine 1.4.   He receives B-12 monthly, most recently on 12/20/2018.   During the interim, he has been "doing good." He denies any fevers, sweats, or weight loss. He denies any bruises or bleeding. He denies use of any new medications or herbal products. He continues to have pain in his legs following a fall. He will undergo an EMG next week.   He is eating well. He feels like he has more energy after receiving B-12 injections. He reports he scratches his hand because it itches a lot. He is unsure when his last colonoscopy was (last report available is from 10/11/2003).    Past Medical History:  Diagnosis Date  . Allergy   . Anemia   . Diabetes mellitus without complication [facility])     Past Surgical History:  Procedure Laterality Date  . INNER EAR SURGERY  1990    Family History  Problem Relation Age of Onset  . Cancer Mother   . Cancer Sister     Social History:  reports that he has never smoked. He has never used smokeless tobacco. He reports previous alcohol use. He reports that he does not use drugs. He drank as a teenager.  He denies any exposure to radiation or toxins.  He lives in [place].  His wife's name is Claris CheMargaret.  The patient is alone today.  Allergies: No Known Allergies  Current Medications:  Current Outpatient Medications  Medication Sig Dispense Refill  . aspirin 81 MG chewable tablet Chew by mouth.    Marland Kitchen. atorvastatin (LIPITOR) 40 MG tablet Take by mouth.    . metFORMIN (GLUCOPHAGE-XR) 500 MG 24 hr tablet Take by mouth.    . MULTIPLE VITAMIN PO Take 1 tablet by mouth daily.    . Omega-3 Fatty Acids (FISH OIL) 1200 MG CAPS Take 1 capsule by mouth daily.     No current facility-administered medications for this visit.     Review of Systems  Constitutional: Negative for chills, diaphoresis, fever, malaise/fatigue and weight loss (stable).       Feels "good".  HENT: Negative.  Negative for congestion, ear pain, nosebleeds, sinus pain and sore throat.   Eyes: Negative.  Negative for blurred vision, double vision, photophobia and pain.  Respiratory: Negative.  Negative for cough, hemoptysis, sputum production and shortness of breath.   Cardiovascular: Negative.  Negative for chest pain, palpitations, orthopnea, leg swelling and PND.  Gastrointestinal: Negative.  Negative for abdominal pain, blood in stool, constipation, diarrhea, melena, nausea and vomiting.       Eating well.  Genitourinary: Negative.  Negative for dysuria, frequency, hematuria and urgency.  Musculoskeletal: Negative for back pain, falls, joint pain, myalgias and neck pain.  Skin: Positive for itching (left hand). Negative for rash.  Neurological: Positive for sensory change (in legs since fell on black ice in 2009). Negative for dizziness,  tremors, speech change, focal weakness, weakness and headaches.  Endo/Heme/Allergies: Negative.  Does not bruise/bleed easily.  Psychiatric/Behavioral: Negative.  Negative for depression and memory loss. The patient is not nervous/anxious and does not have insomnia.   All other systems reviewed and are negative.  Performance status (ECOG): 1  Blood pressure 128/61, pulse 67, temperature (!) 97.4 F (36.3 C), temperature source Tympanic, resp. rate 18, height 5\' 5"  (1.651  m), weight 230 lb 6.1 oz (104.5 kg), SpO2 99 %.   Physical Exam  Constitutional: He is oriented to person, place, and time. He appears well-developed and well-nourished. No distress.  HENT:  Head: Normocephalic and atraumatic.  Mouth/Throat: Oropharynx is clear and moist. No oropharyngeal exudate.  Short gray hair and beard. Mask.  Eyes: Pupils are equal, round, and reactive to light. Conjunctivae and EOM are normal. No scleral icterus.  Brown eyes.  Neck: Normal range of motion. Neck supple.  Cardiovascular: Normal rate, regular rhythm and normal heart sounds.  No murmur heard. Pulmonary/Chest: Effort normal and breath sounds normal. No respiratory distress. He has no wheezes.  Abdominal: Soft. Bowel sounds are normal. He exhibits no distension. There is no abdominal tenderness.  Musculoskeletal: Normal range of motion.        General: No edema.  Lymphadenopathy:    He has no cervical adenopathy.    He has no axillary adenopathy.       Right: No supraclavicular adenopathy present.       Left: No supraclavicular adenopathy present.  Neurological: He is alert and oriented to person, place, and time.  Skin: Skin is warm and dry. He is not diaphoretic. No erythema.  Psychiatric: He has a normal mood and affect. His behavior is normal. Judgment and thought content normal.  Nursing note and vitals reviewed.   Appointment on 01/17/2019  Component Date Value Ref Range Status  . Sodium 01/17/2019 139  135 - 145 mmol/L Final  . Potassium 01/17/2019 4.4  3.5 - 5.1 mmol/L Final  . Chloride 01/17/2019 108  98 - 111 mmol/L Final  . CO2 01/17/2019 23  22 - 32 mmol/L Final  . Glucose, Bld 01/17/2019 138* 70 - 99 mg/dL Final  . BUN 40/98/119107/02/2019 15  8 - 23 mg/dL Final  . Creatinine, Ser 01/17/2019 1.26* 0.61 - 1.24 mg/dL Final  . Calcium 47/82/956207/02/2019 9.0  8.9 - 10.3 mg/dL Final  . Total Protein 01/17/2019 6.8  6.5 - 8.1 g/dL Final  . Albumin 13/08/657807/02/2019 4.1  3.5 - 5.0 g/dL Final  . AST 46/96/295207/02/2019  29  15 - 41 U/L Final  . ALT 01/17/2019 26  0 - 44 U/L Final  . Alkaline Phosphatase 01/17/2019 55  38 - 126 U/L Final  . Total Bilirubin 01/17/2019 0.8  0.3 - 1.2 mg/dL Final  . GFR calc non Af Amer 01/17/2019 57* >60 mL/min Final  . GFR calc Af Amer 01/17/2019 >60  >60 mL/min Final  . Anion gap 01/17/2019 8  5 - 15 Final   Performed at [facility], [address] [place], [place] [address]  . WBC 01/17/2019 6.2  4.0 - 10.5 K/uL Final  . RBC 01/17/2019 3.97* 4.22 - 5.81 MIL/uL Final  . Hemoglobin 01/17/2019 12.9* 13.0 - 17.0 g/dL Final  . HCT 44/01/027207/02/2019 36.7* 39.0 - 52.0 % Final  . MCV 01/17/2019 92.4  80.0 - 100.0 fL Final  . MCH 01/17/2019 32.5  26.0 - 34.0 pg Final  . MCHC 01/17/2019 35.1  30.0 - 36.0 g/dL  Final  . RDW 01/17/2019 12.6  11.5 - 15.5 % Final  . Platelets 01/17/2019 128* 150 - 400 K/uL Final   Comment: Immature Platelet Fraction may be clinically indicated, consider ordering this additional test [id number]   . nRBC 01/17/2019 0.0  0.0 - 0.2 % Final  . Neutrophils Relative % 01/17/2019 49  % Final  . Neutro Abs 01/17/2019 3.2  1.7 - 7.7 K/uL Final  . Lymphocytes Relative 01/17/2019 35  % Final  . Lymphs Abs 01/17/2019 2.2  0.7 - 4.0 K/uL Final  . Monocytes Relative 01/17/2019 10  % Final  . Monocytes Absolute 01/17/2019 0.6  0.1 - 1.0 K/uL Final  . Eosinophils Relative 01/17/2019 4  % Final  . Eosinophils Absolute 01/17/2019 0.2  0.0 - 0.5 K/uL Final  . Basophils Relative 01/17/2019 1  % Final  . Basophils Absolute 01/17/2019 0.0  0.0 - 0.1 K/uL Final  . Immature Granulocytes 01/17/2019 1  % Final  . Abs Immature Granulocytes 01/17/2019 0.05  0.00 - 0.07 K/uL Final   Performed at [facility], [address] [place], [place] [address]    Assessment:  Cristian BareJoseph W Rogers is a 73 y.o. male with mild thrombocytopenia and B12 deficiency.  He denies any new medications or herbal products except for saw palmetto and Lipitor (incidence of  thrombocytopenia < 1%).  He denies any alcohol use.  Diet is fair.   Work-up on 12/07/2017 revealed revealed a hematocrit of 38.4, hemoglobin 13.5, MCV 91.2, platelets 145,000, WBC 7100 with an ANC of 4000.  Differential was unremarkable.  Normal studies included:  ANA, hepatitis B core antibody total, hepatitis B surface antigen, hepatitis C antibody, HIV testing, SPEP, and free light chains.  PT and PTT were normal.  Ferritin was 157.  Iron saturation was 24% with a TIBC of 299.  Folate was 12.9.  Retic was 1.7%.  B12 was 201 (low).    Abdominal ultrasound on 02/20/2018 revealed a normal spleen and hepatic steatosis.   He has a a history of a mild normocytic anemia.  Hemoglobin was 13.9 on 08/02/2018.  Last colonoscopy was < 10 years ago.  He has B12 deficiency.  B12 was 201 on 12/07/2017.  He received weekly B12 x 6 (12/14/2017 - 01/18/2018) then monthly (last 12/20/2018).  Anti-pareital cell antibodies and intrinsic factor antibodies were negative on 02/15/2018.  He has a family history of malignancy.  His father died of prostate cancer.  His mother and sister died of breast cancer.  PSA was 1.75.    Symptomatically, he is doing well.  He denies any B symptoms.  Exam is stable.  Plan: 1.   Labs today:  CBC with diff, CMP. 2.   Thrombocytopenia Platelet count is 128,000. He denies any bleeding. Exam reveals no hepatosplenomegaly. 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  Continue to monitor. 3.   B12 deficiency B12 was 201 on 12/07/2017. B12 today and monthly x 6. Monitor folate periodically. 4.   RTC in 3 months (during B12 injection) for labs (CBC, ferritin, iron studies, folate). 5.   RTC in 6 months for MD assessment and labs (CBC with diff) and B12.  I discussed the assessment and treatment plan with the patient.  The patient was provided an opportunity to ask questions and all were answered.  The patient agreed with the plan and demonstrated an understanding of  the instructions.  The patient was advised to call back if the symptoms worsen or if the  condition fails to improve as anticipated.  I provided 15 minutes of face-to-face time during this this encounter and > 50% was spent counseling as documented under my assessment and plan.    Lequita Asal, MD, PhD    01/17/2019, 1:28 PM  I, Molly Dorshimer, am acting as Education administrator for [employer]. Mike Gip, MD, PhD.  I, Melissa C. Mike Gip, MD, have reviewed the above documentation for accuracy and completeness, and I agree with the above.

## 2019-01-16 ENCOUNTER — Other Ambulatory Visit: Payer: Self-pay

## 2019-01-17 ENCOUNTER — Encounter: Payer: Self-pay | Admitting: Hematology and Oncology

## 2019-01-17 ENCOUNTER — Inpatient Hospital Stay: Payer: Medicare Other

## 2019-01-17 ENCOUNTER — Other Ambulatory Visit: Payer: Self-pay | Admitting: Hematology and Oncology

## 2019-01-17 ENCOUNTER — Inpatient Hospital Stay: Payer: Medicare Other | Attending: Hematology and Oncology | Admitting: Hematology and Oncology

## 2019-01-17 VITALS — BP 128/61 | HR 67 | Temp 97.4°F | Resp 18 | Ht 65.0 in | Wt 230.4 lb

## 2019-01-17 DIAGNOSIS — E538 Deficiency of other specified B group vitamins: Secondary | ICD-10-CM | POA: Insufficient documentation

## 2019-01-17 DIAGNOSIS — Z79899 Other long term (current) drug therapy: Secondary | ICD-10-CM | POA: Diagnosis not present

## 2019-01-17 DIAGNOSIS — D696 Thrombocytopenia, unspecified: Secondary | ICD-10-CM

## 2019-01-17 LAB — COMPREHENSIVE METABOLIC PANEL
ALT: 26 U/L (ref 0–44)
AST: 29 U/L (ref 15–41)
Albumin: 4.1 g/dL (ref 3.5–5.0)
Alkaline Phosphatase: 55 U/L (ref 38–126)
Anion gap: 8 (ref 5–15)
BUN: 15 mg/dL (ref 8–23)
CO2: 23 mmol/L (ref 22–32)
Calcium: 9 mg/dL (ref 8.9–10.3)
Chloride: 108 mmol/L (ref 98–111)
Creatinine, Ser: 1.26 mg/dL — ABNORMAL HIGH (ref 0.61–1.24)
GFR calc Af Amer: >60 mL/min (ref >60)
GFR calc non Af Amer: 57 mL/min — ABNORMAL LOW (ref >60)
Glucose, Bld: 138 mg/dL — ABNORMAL HIGH (ref 70–99)
Potassium: 4.4 mmol/L (ref 3.5–5.1)
Sodium: 139 mmol/L (ref 135–145)
Total Bilirubin: 0.8 mg/dL (ref 0.3–1.2)
Total Protein: 6.8 g/dL (ref 6.5–8.1)

## 2019-01-17 LAB — CBC WITH DIFFERENTIAL/PLATELET
Abs Immature Granulocytes: 0.05 10*3/uL (ref 0.00–0.07)
Basophils Absolute: 0 10*3/uL (ref 0.0–0.1)
Basophils Relative: 1 %
Eosinophils Absolute: 0.2 10*3/uL (ref 0.0–0.5)
Eosinophils Relative: 4 %
HCT: 36.7 % — ABNORMAL LOW (ref 39.0–52.0)
Hemoglobin: 12.9 g/dL — ABNORMAL LOW (ref 13.0–17.0)
Immature Granulocytes: 1 %
Lymphocytes Relative: 35 %
Lymphs Abs: 2.2 10*3/uL (ref 0.7–4.0)
MCH: 32.5 pg (ref 26.0–34.0)
MCHC: 35.1 g/dL (ref 30.0–36.0)
MCV: 92.4 fL (ref 80.0–100.0)
Monocytes Absolute: 0.6 10*3/uL (ref 0.1–1.0)
Monocytes Relative: 10 %
Neutro Abs: 3.2 10*3/uL (ref 1.7–7.7)
Neutrophils Relative %: 49 %
Platelets: 128 10*3/uL — ABNORMAL LOW (ref 150–400)
RBC: 3.97 MIL/uL — ABNORMAL LOW (ref 4.22–5.81)
RDW: 12.6 % (ref 11.5–15.5)
WBC: 6.2 10*3/uL (ref 4.0–10.5)
nRBC: 0 % (ref 0.0–0.2)

## 2019-01-17 MED ORDER — CYANOCOBALAMIN 1000 MCG/ML IJ SOLN
1000.0000 ug | Freq: Once | INTRAMUSCULAR | Status: AC
  Administered 2019-01-17: 1000 ug via INTRAMUSCULAR

## 2019-01-17 NOTE — Progress Notes (Signed)
No new changes noted today 

## 2019-02-14 ENCOUNTER — Inpatient Hospital Stay: Payer: Medicare Other | Attending: Hematology and Oncology

## 2019-02-14 ENCOUNTER — Other Ambulatory Visit: Payer: Self-pay

## 2019-02-14 VITALS — BP 158/80 | HR 80 | Temp 98.1°F | Resp 18

## 2019-02-14 DIAGNOSIS — Z79899 Other long term (current) drug therapy: Secondary | ICD-10-CM | POA: Diagnosis not present

## 2019-02-14 DIAGNOSIS — E538 Deficiency of other specified B group vitamins: Secondary | ICD-10-CM | POA: Diagnosis present

## 2019-02-14 MED ORDER — CYANOCOBALAMIN 1000 MCG/ML IJ SOLN
1000.0000 ug | Freq: Once | INTRAMUSCULAR | Status: AC
  Administered 2019-02-14: 1000 ug via INTRAMUSCULAR

## 2019-03-13 ENCOUNTER — Other Ambulatory Visit: Payer: Self-pay

## 2019-03-14 ENCOUNTER — Inpatient Hospital Stay: Payer: Medicare Other | Attending: Hematology and Oncology

## 2019-03-14 VITALS — BP 153/92 | HR 81 | Temp 98.6°F | Resp 18

## 2019-03-14 DIAGNOSIS — E538 Deficiency of other specified B group vitamins: Secondary | ICD-10-CM | POA: Diagnosis not present

## 2019-03-14 DIAGNOSIS — D696 Thrombocytopenia, unspecified: Secondary | ICD-10-CM | POA: Diagnosis not present

## 2019-03-14 DIAGNOSIS — Z79899 Other long term (current) drug therapy: Secondary | ICD-10-CM | POA: Diagnosis not present

## 2019-03-14 MED ORDER — CYANOCOBALAMIN 1000 MCG/ML IJ SOLN
1000.0000 ug | Freq: Once | INTRAMUSCULAR | Status: AC
  Administered 2019-03-14: 14:00:00 1000 ug via INTRAMUSCULAR

## 2019-03-14 NOTE — Patient Instructions (Signed)

## 2019-04-10 ENCOUNTER — Other Ambulatory Visit: Payer: Self-pay

## 2019-04-11 ENCOUNTER — Inpatient Hospital Stay: Payer: Medicare Other

## 2019-04-11 ENCOUNTER — Other Ambulatory Visit: Payer: Self-pay

## 2019-04-11 VITALS — BP 156/70 | HR 83 | Temp 97.7°F | Resp 16

## 2019-04-11 DIAGNOSIS — E538 Deficiency of other specified B group vitamins: Secondary | ICD-10-CM

## 2019-04-11 DIAGNOSIS — D696 Thrombocytopenia, unspecified: Secondary | ICD-10-CM

## 2019-04-11 LAB — CBC
HCT: 37.8 % — ABNORMAL LOW (ref 39.0–52.0)
Hemoglobin: 13.3 g/dL (ref 13.0–17.0)
MCH: 32.6 pg (ref 26.0–34.0)
MCHC: 35.2 g/dL (ref 30.0–36.0)
MCV: 92.6 fL (ref 80.0–100.0)
Platelets: 122 10*3/uL — ABNORMAL LOW (ref 150–400)
RBC: 4.08 MIL/uL — ABNORMAL LOW (ref 4.22–5.81)
RDW: 13.1 % (ref 11.5–15.5)
WBC: 5.5 10*3/uL (ref 4.0–10.5)
nRBC: 0 % (ref 0.0–0.2)

## 2019-04-11 LAB — IRON AND TIBC
Iron: 105 ug/dL (ref 45–182)
Saturation Ratios: 35 % (ref 17.9–39.5)
TIBC: 297 ug/dL (ref 250–450)
UIBC: 192 ug/dL

## 2019-04-11 LAB — FOLATE: Folate: 27 ng/mL (ref >5.9)

## 2019-04-11 LAB — FERRITIN: Ferritin: 136 ng/mL (ref 24–336)

## 2019-04-11 MED ORDER — CYANOCOBALAMIN 1000 MCG/ML IJ SOLN
1000.0000 ug | Freq: Once | INTRAMUSCULAR | Status: AC
  Administered 2019-04-11: 1000 ug via INTRAMUSCULAR
  Filled 2019-04-11: qty 1

## 2019-04-11 NOTE — Patient Instructions (Signed)

## 2019-05-09 ENCOUNTER — Inpatient Hospital Stay: Payer: Medicare Other | Attending: Hematology and Oncology

## 2019-05-09 ENCOUNTER — Other Ambulatory Visit: Payer: Self-pay

## 2019-05-09 VITALS — BP 146/78 | HR 74 | Temp 97.5°F | Resp 16

## 2019-05-09 DIAGNOSIS — Z79899 Other long term (current) drug therapy: Secondary | ICD-10-CM | POA: Diagnosis not present

## 2019-05-09 DIAGNOSIS — E538 Deficiency of other specified B group vitamins: Secondary | ICD-10-CM | POA: Insufficient documentation

## 2019-05-09 DIAGNOSIS — Z23 Encounter for immunization: Secondary | ICD-10-CM | POA: Insufficient documentation

## 2019-05-09 MED ORDER — INFLUENZA VAC A&B SA ADJ QUAD 0.5 ML IM PRSY
0.5000 mL | PREFILLED_SYRINGE | Freq: Once | INTRAMUSCULAR | Status: AC
  Administered 2019-05-09: 0.5 mL via INTRAMUSCULAR

## 2019-05-09 MED ORDER — CYANOCOBALAMIN 1000 MCG/ML IJ SOLN
1000.0000 ug | Freq: Once | INTRAMUSCULAR | Status: AC
  Administered 2019-05-09: 1000 ug via INTRAMUSCULAR

## 2019-05-09 NOTE — Patient Instructions (Signed)
Preventing Influenza, Adult Influenza, more commonly known as "the flu," is a viral infection that mainly affects the respiratory tract. The respiratory tract includes structures that help you breathe, such as the lungs, nose, and throat. The flu causes many common cold symptoms, as well as a high fever and body aches. The flu spreads easily from person to person (is contagious). The flu is most common from December through March. This is called flu season.You can catch the flu virus by:  Breathing in droplets from an infected person's cough or sneeze.  Touching something that was recently contaminated with the virus and then touching your mouth, nose, or eyes. What can I do to lower my risk?        You can decrease your risk of getting the flu by:  Getting a flu shot (influenza vaccination) every year. This is the best way to prevent the flu. A flu shot is recommended for everyone age 59 months and older. ? It is best to get a flu shot in the fall, as soon as it is available. Getting a flu shot during winter or spring instead is still a good idea. Flu season can last into early spring. ? Preventing the flu through vaccination requires getting a new flu shot every year. This is because the flu virus changes slightly (mutates) from one year to the next. Even if a flu shot does not completely protect you from all flu virus mutations, it can reduce the severity of your illness and prevent dangerous complications of the flu. ? If you are pregnant, you can and should get a flu shot. ? If you have had a reaction to the shot in the past or if you are allergic to eggs, check with your health care provider before getting a flu shot. ? Sometimes the vaccine is available as a nasal spray. In some years, the nasal spray has not been as effective against the flu virus. Check with your health care provider if you have questions about this.  Practicing good health habits. This is especially important during  flu season. ? Avoid contact with people who are sick with flu or cold symptoms. ? Wash your hands with soap and water often. If soap and water are not available, use alcohol-based hand sanitizer. ? Avoid touching your hands to your face, especially when you have not washed your hands recently. ? Use a disinfectant to clean surfaces at home and at work that may be contaminated with the flu virus. ? Keep your body's disease-fighting system (immune system) in good shape by eating a healthy diet, drinking plenty of fluids, getting enough sleep, and exercising regularly. If you do get the flu, avoid spreading it to others by:  Staying home until your symptoms have been gone for at least one day.  Covering your mouth and nose when you cough or sneeze.  Avoiding close contact with others, especially babies and elderly people. Why are these changes important? Getting a flu shot and practicing good health habits protects you as well as other people. If you get the flu, your friends, family, and co-workers are also at risk of getting it, because it spreads so easily to others. Each year, about 2 out of every 10 people get the flu. Having the flu can lead to complications, such as pneumonia, ear infection, and sinus infection. The flu also can be deadly, especially for babies, people older than age 71, and people who have serious long-term diseases. How is this treated? Most  people recover from the flu by resting at home and drinking plenty of fluids. However, a prescription antiviral medicine may reduce your flu symptoms and may make your flu go away sooner. This medicine must be started within a few days of getting flu symptoms. You can talk with your health care provider about whether you need an antiviral medicine. Antiviral medicine may be prescribed for people who are at risk for more serious flu symptoms. This includes people who:  Are older than age 80.  Are pregnant.  Have a condition that  makes the flu worse or more dangerous. Where to find more information  Centers for Disease Control and Prevention: http://www.smith-bell.org/  [employer].com.ee: azureicus.com  American Academy of Family Physicians: familydoctor.org/familydoctor/en/kids/vaccines/preventing-the-flu.html Contact a health care provider if:  You have influenza and you develop new symptoms.  You have: ? Chest pain. ? Diarrhea. ? A fever.  Your cough gets worse, or you produce more mucus. Summary  The best way to prevent the flu is to get a flu shot every year in the fall.  Even if you get the flu after you have received the yearly vaccine, your flu may be milder and go away sooner because of your flu shot.  If you get the flu, antiviral medicines that are started with a few days of symptoms may reduce your flu symptoms and may make your flu go away sooner.  You can also help prevent the flu by practicing good health habits. This information is not intended to replace advice given to you by your health care provider. Make sure you discuss any questions you have with your health care provider. Document Released: 07/13/2015 Document Revised: 06/10/2017 Document Reviewed: 03/06/2016 Elsevier Patient Education  [place]. Cyanocobalamin, Vitamin B12 injection What is this medicine? CYANOCOBALAMIN (sye an oh koe BAL a min) is a man made form of vitamin B12. Vitamin B12 is used in the growth of healthy blood cells, nerve cells, and proteins in the body. It also helps with the metabolism of fats and carbohydrates. This medicine is used to treat people who can not absorb vitamin B12. This medicine may be used for other purposes; ask your health care provider or pharmacist if you have questions. COMMON BRAND NAME(S): B-12 Compliance Kit, B-12 Injection Kit, Cyomin, LA-12, Nutri-Twelve, Physicians EZ Use B-12, Primabalt What should I tell my health care provider before I take this medicine?  They need to know if you have any of these conditions:  kidney disease  Leber's disease  megaloblastic anemia  an unusual or allergic reaction to cyanocobalamin, cobalt, other medicines, foods, dyes, or preservatives  pregnant or trying to get pregnant  breast-feeding How should I use this medicine? This medicine is injected into a muscle or deeply under the skin. It is usually given by a health care professional in a clinic or doctor's office. However, your doctor may teach you how to inject yourself. Follow all instructions. Talk to your pediatrician regarding the use of this medicine in children. Special care may be needed. Overdosage: If you think you have taken too much of this medicine contact a poison control center or emergency room at once. NOTE: This medicine is only for you. Do not share this medicine with others. What if I miss a dose? If you are given your dose at a clinic or doctor's office, call to reschedule your appointment. If you give your own injections and you miss a dose, take it as soon as you can. If it is almost  time for your next dose, take only that dose. Do not take double or extra doses. What may interact with this medicine?  colchicine  heavy alcohol intake This list may not describe all possible interactions. Give your health care provider a list of all the medicines, herbs, non-prescription drugs, or dietary supplements you use. Also tell them if you smoke, drink alcohol, or use illegal drugs. Some items may interact with your medicine. What should I watch for while using this medicine? Visit your doctor or health care professional regularly. You may need blood work done while you are taking this medicine. You may need to follow a special diet. Talk to your doctor. Limit your alcohol intake and avoid smoking to get the best benefit. What side effects may I notice from receiving this medicine? Side effects that you should report to your doctor or health  care professional as soon as possible:  allergic reactions like skin rash, itching or hives, swelling of the face, lips, or tongue  blue tint to skin  chest tightness, pain  difficulty breathing, wheezing  dizziness  red, swollen painful area on the leg Side effects that usually do not require medical attention (report to your doctor or health care professional if they continue or are bothersome):  diarrhea  headache This list may not describe all possible side effects. Call your doctor for medical advice about side effects. You may report side effects to FDA at 1-800-FDA-1088. Where should I keep my medicine? Keep out of the reach of children. Store at room temperature between 15 and 30 degrees C (59 and 85 degrees F). Protect from light. Throw away any unused medicine after the expiration date. NOTE: This sheet is a summary. It may not cover all possible information. If you have questions about this medicine, talk to your doctor, pharmacist, or health care provider.  2020 Elsevier/Gold Standard (2007-10-09 22:10:20)

## 2019-06-05 ENCOUNTER — Other Ambulatory Visit: Payer: Self-pay

## 2019-06-06 ENCOUNTER — Inpatient Hospital Stay: Payer: Medicare Other | Attending: Hematology and Oncology

## 2019-06-06 VITALS — BP 129/72 | HR 93 | Resp 16

## 2019-06-06 DIAGNOSIS — E538 Deficiency of other specified B group vitamins: Secondary | ICD-10-CM | POA: Insufficient documentation

## 2019-06-06 DIAGNOSIS — Z79899 Other long term (current) drug therapy: Secondary | ICD-10-CM | POA: Diagnosis not present

## 2019-06-06 MED ORDER — CYANOCOBALAMIN 1000 MCG/ML IJ SOLN
1000.0000 ug | Freq: Once | INTRAMUSCULAR | Status: AC
  Administered 2019-06-06: 14:00:00 1000 ug via INTRAMUSCULAR

## 2019-07-04 ENCOUNTER — Ambulatory Visit: Payer: Medicare Other

## 2019-07-11 ENCOUNTER — Other Ambulatory Visit: Payer: Self-pay

## 2019-07-11 ENCOUNTER — Inpatient Hospital Stay: Payer: Medicare Other | Attending: Hematology and Oncology

## 2019-07-11 VITALS — BP 128/67 | HR 86 | Resp 16

## 2019-07-11 DIAGNOSIS — Z79899 Other long term (current) drug therapy: Secondary | ICD-10-CM | POA: Insufficient documentation

## 2019-07-11 DIAGNOSIS — E538 Deficiency of other specified B group vitamins: Secondary | ICD-10-CM

## 2019-07-11 MED ORDER — CYANOCOBALAMIN 1000 MCG/ML IJ SOLN
1000.0000 ug | Freq: Once | INTRAMUSCULAR | Status: AC
  Administered 2019-07-11: 14:00:00 1000 ug via INTRAMUSCULAR

## 2019-07-11 NOTE — Patient Instructions (Signed)

## 2019-08-01 ENCOUNTER — Other Ambulatory Visit: Payer: Medicare Other

## 2019-08-01 ENCOUNTER — Ambulatory Visit: Payer: Medicare Other

## 2019-08-01 ENCOUNTER — Ambulatory Visit: Payer: Medicare Other | Admitting: Hematology and Oncology

## 2019-08-05 NOTE — Progress Notes (Signed)
[facility]  [address], [address] [place], [place] [address] Phone: [phone]  Fax: [phone]   Clinic Day:  08/08/2019  Referring physician: Rolm Gala, MD  Chief Complaint: Cristian Rogers is a 74 y.o. male with thrombocytopenia and B12 deficiency who is seen for 6 month assessment on oral B12.  HPI: The patient was last seen in the hematology clinic on 01/17/2019. At that time, he was doing well.  He denied any B symptoms.  Exam was stable. Hematocrit was 36.7, hemoglobin was 12.9.   He has received monthly B12 injections (01/17/2019 - 07/11/2019).  Labs on 04/11/2019 revealed a hematocrit 37.8, hemoglobin 13.3, MCV 92.6, platelets 122,000, WBC 5500. Ferritin was 136 with an iron saturation of 35% and TIBC 297.  Folate was 27.0.  During the interim, he has been "hanging in there".  He states "all is good".   He denies any bruising or bleeding.  He denies any interval infections.  He denies any pain.  He takes OTC cream to help with itching on his left hand.  He goes on walks as his form of exercise.  He says he will see Dr. Rolm Gala, PCP in 08/2019.   Past Medical History:  Diagnosis Date  . Allergy   . Anemia   . Diabetes mellitus without complication [facility])     Past Surgical History:  Procedure Laterality Date  . INNER EAR SURGERY  1990    Family History  Problem Relation Age of Onset  . Cancer Mother   . Cancer Sister     Social History:  reports that he has never smoked. He has never used smokeless tobacco. He reports previous alcohol use. He reports that he does not use drugs. He drank as a teenager. He denies any exposure to radiation or toxins. His wife's name is Claris Che.  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  He lives in [place]. The patient is alone today.  Allergies: No Known Allergies  Current Medications: Current Outpatient Medications  Medication Sig Dispense Refill  . aspirin 81 MG chewable tablet Chew by  mouth.    Marland Kitchen atorvastatin (LIPITOR) 40 MG tablet Take by mouth.    . Garlic 1000 MG CAPS Take by mouth.    . metFORMIN (GLUCOPHAGE-XR) 500 MG 24 hr tablet Take by mouth.    . MULTIPLE VITAMIN PO Take 1 tablet by mouth daily.    . Omega-3 Fatty Acids (FISH OIL) 1200 MG CAPS Take 1 capsule by mouth daily.     No current facility-administered medications for this visit.    Review of Systems  Constitutional: Positive for weight loss (3 lbs). Negative for chills, diaphoresis, fever and malaise/fatigue.       He notes "all is good".  HENT: Negative.  Negative for congestion, ear pain, nosebleeds, sinus pain and sore throat.   Eyes: Negative.  Negative for blurred vision, double vision and photophobia.  Respiratory: Negative.  Negative for cough, hemoptysis, sputum production and shortness of breath.   Cardiovascular: Negative.  Negative for chest pain, palpitations, orthopnea, leg swelling and PND.  Gastrointestinal: Negative.  Negative for abdominal pain, blood in stool, constipation, diarrhea, melena, nausea and vomiting.       Eating well.  Genitourinary: Negative.  Negative for dysuria, frequency, hematuria and urgency.  Musculoskeletal: Negative.  Negative for back pain, falls, joint pain, myalgias and neck pain.  Skin: Positive for itching (left hand using OTC cream). Negative for rash.  Neurological: Positive for sensory change (in  legs since fell on black ice in 2009). Negative for dizziness, tremors, speech change, focal weakness, weakness and headaches.  Endo/Heme/Allergies: Negative.  Does not bruise/bleed easily.  Psychiatric/Behavioral: Negative.  Negative for depression and memory loss. The patient is not nervous/anxious and does not have insomnia.   All other systems reviewed and are negative.  Performance status (ECOG):  1  Vitals Blood pressure 131/68, pulse 76, temperature (!) 97.4 F (36.3 C), temperature source Tympanic, resp. rate 16, weight 227 lb 15.3 oz (103.4 kg),  SpO2 98 %.   Physical Exam  Constitutional: He is oriented to person, place, and time. He appears well-developed and well-nourished. No distress.  HENT:  Head: Normocephalic and atraumatic.  Mouth/Throat: Oropharynx is clear and moist. No oropharyngeal exudate.  Cap.  Short gray hair and beard.  Mask.  Eyes: Pupils are equal, round, and reactive to light. Conjunctivae and EOM are normal. No scleral icterus.  Brown eyes.  Neck: No JVD present.  Cardiovascular: Normal rate, regular rhythm and normal heart sounds.  No murmur heard. Pulmonary/Chest: Effort normal and breath sounds normal. No respiratory distress. He has no wheezes. He has no rales.  Abdominal: Soft. Bowel sounds are normal. He exhibits no distension. There is no abdominal tenderness. There is no rebound and no guarding.  Musculoskeletal:        General: No edema.     Cervical back: Normal range of motion and neck supple.  Lymphadenopathy:       Head (right side): No preauricular, no posterior auricular and no occipital adenopathy present.       Head (left side): No preauricular, no posterior auricular and no occipital adenopathy present.    He has no cervical adenopathy.    He has no axillary adenopathy.       Right: No inguinal and no supraclavicular adenopathy present.       Left: No inguinal and no supraclavicular adenopathy present.  Neurological: He is oriented to person, place, and time.  Skin: Skin is warm and dry. No rash noted. He is not diaphoretic. No erythema. No pallor.  Psychiatric: He has a normal mood and affect. His behavior is normal. Judgment and thought content normal.  Nursing note and vitals reviewed.   Appointment on 08/08/2019  Component Date Value Ref Range Status  . WBC 08/08/2019 7.3  4.0 - 10.5 K/uL Final  . RBC 08/08/2019 4.02* 4.22 - 5.81 MIL/uL Final  . Hemoglobin 08/08/2019 12.8* 13.0 - 17.0 g/dL Final  . HCT 08/08/2019 36.7* 39.0 - 52.0 % Final  . MCV 08/08/2019 91.3  80.0 - 100.0 fL  Final  . MCH 08/08/2019 31.8  26.0 - 34.0 pg Final  . MCHC 08/08/2019 34.9  30.0 - 36.0 g/dL Final  . RDW 08/08/2019 12.7  11.5 - 15.5 % Final  . Platelets 08/08/2019 123* 150 - 400 K/uL Final   Comment: Immature Platelet Fraction may be clinically indicated, consider ordering this additional test [id number]   . nRBC 08/08/2019 0.0  0.0 - 0.2 % Final  . Neutrophils Relative % 08/08/2019 54  % Final  . Neutro Abs 08/08/2019 3.9  1.7 - 7.7 K/uL Final  . Lymphocytes Relative 08/08/2019 33  % Final  . Lymphs Abs 08/08/2019 2.4  0.7 - 4.0 K/uL Final  . Monocytes Relative 08/08/2019 9  % Final  . Monocytes Absolute 08/08/2019 0.7  0.1 - 1.0 K/uL Final  . Eosinophils Relative 08/08/2019 3  % Final  . Eosinophils Absolute 08/08/2019 0.3  0.0 -  0.5 K/uL Final  . Basophils Relative 08/08/2019 0  % Final  . Basophils Absolute 08/08/2019 0.0  0.0 - 0.1 K/uL Final  . Immature Granulocytes 08/08/2019 1  % Final  . Abs Immature Granulocytes 08/08/2019 0.04  0.00 - 0.07 K/uL Final   Performed at [facility], [address], [place], [place] [address]    Assessment:  Cristian Rogers is a 74 y.o. male  withmild thrombocytopenia andB12 deficiency. He denies any new medications or herbal products except for saw palmetto and Lipitor (incidence of thrombocytopenia <1%). He denies any alcohol use. Dietis fair.   Work-up on 05/29/2019revealed revealed a hematocrit of 38.4, hemoglobin 13.5, MCV 91.2, platelets 145,000, WBC 7100 with an ANC of 4000. Differential was unremarkable. Normal studiesincluded: ANA, hepatitis B core antibody total, hepatitis B surface antigen, hepatitis C antibody, HIV testing, SPEP, and free light chains. PT and PTT were normal. Ferritin was 157. Iron saturation was 24% with a TIBC of 299. Folate was 12.9. Retic was 1.7%. B12was 201 (low).   Abdominal ultrasound on 02/20/2018 revealed a normal spleen and hepatic steatosis.  He has a a history of a  mild normocytic anemia. Hemoglobin was 13.9 on 08/02/2018.  Last colonoscopy was <10 years ago.  He has B12 deficiency.B12 was 201 on 12/07/2017.He received weekly B12 x 6 (12/14/2017 - 01/18/2018)then monthly (last 07/11/2019).Anti-parietal cell antibodies and intrinsic factor antibodies were negative on 02/15/2018.  Folate was 27 on 04/11/2019.  He has afamily history of malignancy. His father died of prostate cancer. His mother and sister died of breast cancer. PSA was 1.75.   Symptomatically, he is doing well.  He denies any bruising or bleeding.  Exam is unremarkable.  Plan: 1.   Labs today:CBC with diff. 2.   Thrombocytopenia Platelet countis 123,000. He denies any bruising or bleeding.   Exam reveals no adenopathy or hepatosplenomegaly.   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 Exam reveals no hepatosplenomegaly.  Continue to monitor. 3.   B12 deficiency B12 was 201 on 12/07/2017. B12 today and monthly x6. Monitor folate annually. 4.   RTC in 6 months for labs (CBC with diff, B1 and folate). 5.   RTC in 1 year for MD assessment, labs (CBC with diff) and B12.  I discussed the assessment and treatment plan with the patient.  The patient was provided an opportunity to ask questions and all were answered.  The patient agreed with the plan and demonstrated an understanding of the instructions.  The patient was advised to call back if the symptoms worsen or if the condition fails to improve as anticipated.  I provided 10 minutes (1:20 PM - 1:30 PM) of face-to-face time during this this encounter and > 50% was spent counseling as documented under my assessment and plan.    Rosey Bath, MD, PhD    08/08/2019, 1:30 PM  I, Arnette Norris, am acting as a scribe for Rosey Bath, MD.  I, Jennette Leask C. Merlene Pulling, MD, have reviewed the above documentation for accuracy and completeness, and I agree with the above.

## 2019-08-08 ENCOUNTER — Inpatient Hospital Stay: Payer: Medicare PPO

## 2019-08-08 ENCOUNTER — Inpatient Hospital Stay (HOSPITAL_BASED_OUTPATIENT_CLINIC_OR_DEPARTMENT_OTHER): Payer: Medicare PPO | Admitting: Hematology and Oncology

## 2019-08-08 ENCOUNTER — Inpatient Hospital Stay: Payer: Medicare PPO | Attending: Hematology and Oncology

## 2019-08-08 ENCOUNTER — Encounter: Payer: Self-pay | Admitting: Hematology and Oncology

## 2019-08-08 ENCOUNTER — Other Ambulatory Visit: Payer: Self-pay

## 2019-08-08 VITALS — BP 131/68 | HR 76 | Temp 97.4°F | Resp 16 | Wt 228.0 lb

## 2019-08-08 DIAGNOSIS — E538 Deficiency of other specified B group vitamins: Secondary | ICD-10-CM | POA: Diagnosis not present

## 2019-08-08 DIAGNOSIS — D696 Thrombocytopenia, unspecified: Secondary | ICD-10-CM | POA: Diagnosis not present

## 2019-08-08 DIAGNOSIS — Z79899 Other long term (current) drug therapy: Secondary | ICD-10-CM | POA: Diagnosis not present

## 2019-08-08 LAB — CBC WITH DIFFERENTIAL/PLATELET
Abs Immature Granulocytes: 0.04 10*3/uL (ref 0.00–0.07)
Basophils Absolute: 0 10*3/uL (ref 0.0–0.1)
Basophils Relative: 0 %
Eosinophils Absolute: 0.3 10*3/uL (ref 0.0–0.5)
Eosinophils Relative: 3 %
HCT: 36.7 % — ABNORMAL LOW (ref 39.0–52.0)
Hemoglobin: 12.8 g/dL — ABNORMAL LOW (ref 13.0–17.0)
Immature Granulocytes: 1 %
Lymphocytes Relative: 33 %
Lymphs Abs: 2.4 10*3/uL (ref 0.7–4.0)
MCH: 31.8 pg (ref 26.0–34.0)
MCHC: 34.9 g/dL (ref 30.0–36.0)
MCV: 91.3 fL (ref 80.0–100.0)
Monocytes Absolute: 0.7 10*3/uL (ref 0.1–1.0)
Monocytes Relative: 9 %
Neutro Abs: 3.9 10*3/uL (ref 1.7–7.7)
Neutrophils Relative %: 54 %
Platelets: 123 10*3/uL — ABNORMAL LOW (ref 150–400)
RBC: 4.02 MIL/uL — ABNORMAL LOW (ref 4.22–5.81)
RDW: 12.7 % (ref 11.5–15.5)
WBC: 7.3 10*3/uL (ref 4.0–10.5)
nRBC: 0 % (ref 0.0–0.2)

## 2019-08-08 MED ORDER — CYANOCOBALAMIN 1000 MCG/ML IJ SOLN
1000.0000 ug | Freq: Once | INTRAMUSCULAR | Status: AC
  Administered 2019-08-08: 1000 ug via INTRAMUSCULAR

## 2019-08-08 NOTE — Progress Notes (Signed)
No new changes noted today 

## 2019-09-04 ENCOUNTER — Other Ambulatory Visit: Payer: Self-pay

## 2019-09-05 ENCOUNTER — Inpatient Hospital Stay: Payer: Medicare PPO | Attending: Hematology and Oncology

## 2019-09-05 VITALS — BP 159/70 | HR 90 | Temp 97.6°F | Resp 16

## 2019-09-05 DIAGNOSIS — E538 Deficiency of other specified B group vitamins: Secondary | ICD-10-CM

## 2019-09-05 MED ORDER — CYANOCOBALAMIN 1000 MCG/ML IJ SOLN
1000.0000 ug | Freq: Once | INTRAMUSCULAR | Status: AC
  Administered 2019-09-05: 1000 ug via INTRAMUSCULAR

## 2019-09-05 NOTE — Patient Instructions (Signed)

## 2019-10-03 ENCOUNTER — Inpatient Hospital Stay: Payer: Medicare PPO | Attending: Hematology and Oncology

## 2019-10-03 ENCOUNTER — Other Ambulatory Visit: Payer: Self-pay

## 2019-10-03 VITALS — BP 126/72 | HR 75 | Temp 98.3°F | Resp 16

## 2019-10-03 DIAGNOSIS — Z79899 Other long term (current) drug therapy: Secondary | ICD-10-CM | POA: Diagnosis not present

## 2019-10-03 DIAGNOSIS — E538 Deficiency of other specified B group vitamins: Secondary | ICD-10-CM | POA: Diagnosis not present

## 2019-10-03 MED ORDER — CYANOCOBALAMIN 1000 MCG/ML IJ SOLN
1000.0000 ug | Freq: Once | INTRAMUSCULAR | Status: AC
  Administered 2019-10-03: 14:00:00 1000 ug via INTRAMUSCULAR

## 2019-10-03 NOTE — Patient Instructions (Signed)

## 2019-10-31 ENCOUNTER — Other Ambulatory Visit: Payer: Self-pay

## 2019-10-31 ENCOUNTER — Inpatient Hospital Stay: Payer: Medicare PPO | Attending: Hematology and Oncology

## 2019-10-31 VITALS — BP 138/73 | HR 80 | Temp 97.0°F | Resp 16

## 2019-10-31 DIAGNOSIS — E538 Deficiency of other specified B group vitamins: Secondary | ICD-10-CM | POA: Insufficient documentation

## 2019-10-31 DIAGNOSIS — Z79899 Other long term (current) drug therapy: Secondary | ICD-10-CM | POA: Diagnosis not present

## 2019-10-31 MED ORDER — CYANOCOBALAMIN 1000 MCG/ML IJ SOLN
1000.0000 ug | Freq: Once | INTRAMUSCULAR | Status: AC
  Administered 2019-10-31: 1000 ug via INTRAMUSCULAR

## 2019-10-31 NOTE — Patient Instructions (Signed)

## 2019-11-28 ENCOUNTER — Inpatient Hospital Stay: Payer: Medicare PPO | Attending: Hematology and Oncology

## 2019-11-28 ENCOUNTER — Other Ambulatory Visit: Payer: Self-pay

## 2019-11-28 DIAGNOSIS — E538 Deficiency of other specified B group vitamins: Secondary | ICD-10-CM | POA: Insufficient documentation

## 2019-11-28 DIAGNOSIS — Z79899 Other long term (current) drug therapy: Secondary | ICD-10-CM | POA: Insufficient documentation

## 2019-11-28 MED ORDER — CYANOCOBALAMIN 1000 MCG/ML IJ SOLN
1000.0000 ug | Freq: Once | INTRAMUSCULAR | Status: AC
  Administered 2019-11-28: 1000 ug via INTRAMUSCULAR
  Filled 2019-11-28: qty 1

## 2019-12-26 ENCOUNTER — Inpatient Hospital Stay: Payer: Medicare PPO | Attending: Hematology and Oncology

## 2019-12-26 ENCOUNTER — Other Ambulatory Visit: Payer: Self-pay

## 2019-12-26 DIAGNOSIS — E538 Deficiency of other specified B group vitamins: Secondary | ICD-10-CM

## 2019-12-26 DIAGNOSIS — Z79899 Other long term (current) drug therapy: Secondary | ICD-10-CM | POA: Diagnosis not present

## 2019-12-26 MED ORDER — CYANOCOBALAMIN 1000 MCG/ML IJ SOLN
1000.0000 ug | Freq: Once | INTRAMUSCULAR | Status: AC
  Administered 2019-12-26: 1000 ug via INTRAMUSCULAR
  Filled 2019-12-26: qty 1

## 2020-01-23 ENCOUNTER — Inpatient Hospital Stay: Payer: Medicare PPO

## 2020-01-23 ENCOUNTER — Other Ambulatory Visit: Payer: Self-pay

## 2020-01-23 ENCOUNTER — Inpatient Hospital Stay: Payer: Medicare PPO | Attending: Hematology and Oncology

## 2020-01-23 DIAGNOSIS — E538 Deficiency of other specified B group vitamins: Secondary | ICD-10-CM | POA: Diagnosis present

## 2020-01-23 DIAGNOSIS — D696 Thrombocytopenia, unspecified: Secondary | ICD-10-CM

## 2020-01-23 DIAGNOSIS — Z79899 Other long term (current) drug therapy: Secondary | ICD-10-CM | POA: Diagnosis not present

## 2020-01-23 LAB — VITAMIN B12: Vitamin B-12: 464 pg/mL (ref 180–914)

## 2020-01-23 LAB — CBC WITH DIFFERENTIAL/PLATELET
Abs Immature Granulocytes: 0.03 10*3/uL (ref 0.00–0.07)
Basophils Absolute: 0 10*3/uL (ref 0.0–0.1)
Basophils Relative: 0 %
Eosinophils Absolute: 0.2 10*3/uL (ref 0.0–0.5)
Eosinophils Relative: 4 %
HCT: 36 % — ABNORMAL LOW (ref 39.0–52.0)
Hemoglobin: 12.8 g/dL — ABNORMAL LOW (ref 13.0–17.0)
Immature Granulocytes: 1 %
Lymphocytes Relative: 37 %
Lymphs Abs: 2.1 10*3/uL (ref 0.7–4.0)
MCH: 31.8 pg (ref 26.0–34.0)
MCHC: 35.6 g/dL (ref 30.0–36.0)
MCV: 89.6 fL (ref 80.0–100.0)
Monocytes Absolute: 0.6 10*3/uL (ref 0.1–1.0)
Monocytes Relative: 11 %
Neutro Abs: 2.8 10*3/uL (ref 1.7–7.7)
Neutrophils Relative %: 47 %
Platelets: 116 10*3/uL — ABNORMAL LOW (ref 150–400)
RBC: 4.02 MIL/uL — ABNORMAL LOW (ref 4.22–5.81)
RDW: 12.7 % (ref 11.5–15.5)
WBC: 5.8 10*3/uL (ref 4.0–10.5)
nRBC: 0 % (ref 0.0–0.2)

## 2020-01-23 LAB — FOLATE: Folate: 40 ng/mL (ref >5.9)

## 2020-01-23 MED ORDER — CYANOCOBALAMIN 1000 MCG/ML IJ SOLN
1000.0000 ug | Freq: Once | INTRAMUSCULAR | Status: AC
  Administered 2020-01-23: 1000 ug via INTRAMUSCULAR

## 2020-01-23 MED ORDER — CYANOCOBALAMIN 1000 MCG/ML IJ SOLN
INTRAMUSCULAR | Status: AC
  Filled 2020-01-23: qty 1

## 2020-02-20 ENCOUNTER — Inpatient Hospital Stay: Payer: Medicare PPO | Attending: Hematology and Oncology

## 2020-02-20 ENCOUNTER — Other Ambulatory Visit: Payer: Self-pay

## 2020-02-20 DIAGNOSIS — E538 Deficiency of other specified B group vitamins: Secondary | ICD-10-CM

## 2020-02-20 MED ORDER — CYANOCOBALAMIN 1000 MCG/ML IJ SOLN
1000.0000 ug | Freq: Once | INTRAMUSCULAR | Status: AC
  Administered 2020-02-20: 1000 ug via INTRAMUSCULAR
  Filled 2020-02-20: qty 1

## 2020-03-19 ENCOUNTER — Inpatient Hospital Stay: Payer: Medicare PPO | Attending: Hematology and Oncology

## 2020-03-19 ENCOUNTER — Other Ambulatory Visit: Payer: Self-pay

## 2020-03-19 DIAGNOSIS — E538 Deficiency of other specified B group vitamins: Secondary | ICD-10-CM | POA: Diagnosis present

## 2020-03-19 IMAGING — US US ABDOMEN COMPLETE
1 series · 14 of 25 positions shown · non-contrast
Comparison: None.

CLINICAL DATA: Thrombocytopenia.

EXAM:
ABDOMEN ULTRASOUND COMPLETE

[Series 1: us abdomen complete · 0.25mm/px · 14 of 90 slices shown]
[im 1/90]
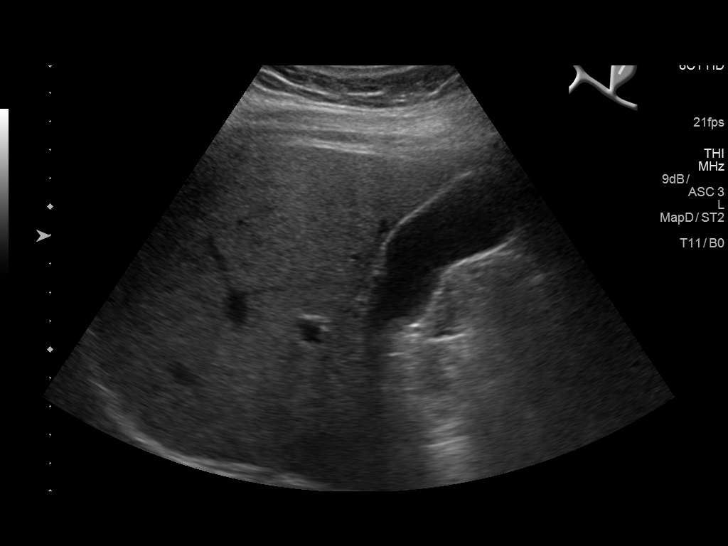
[im 8/90]
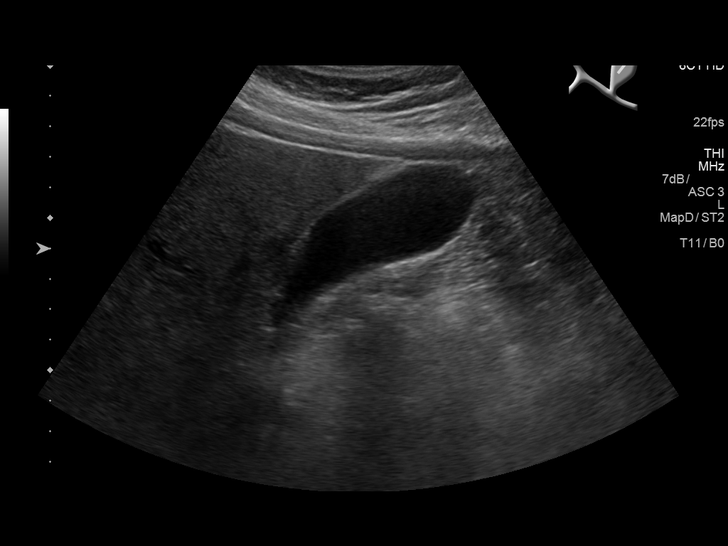
[im 15/90]
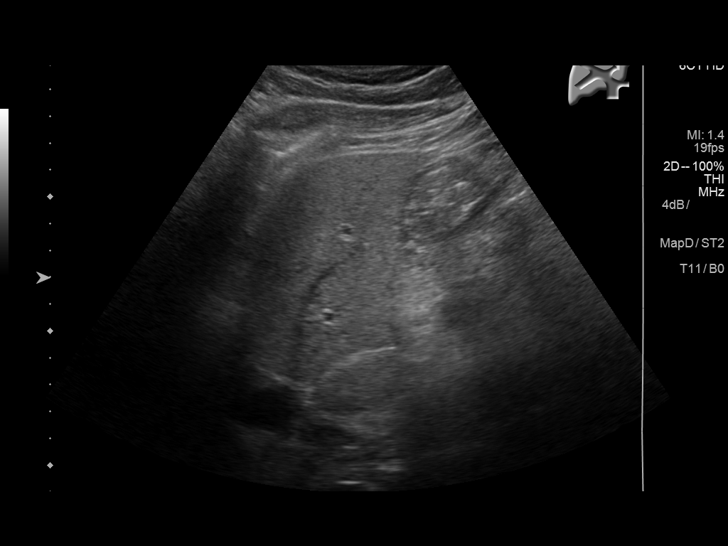
[im 23/90]
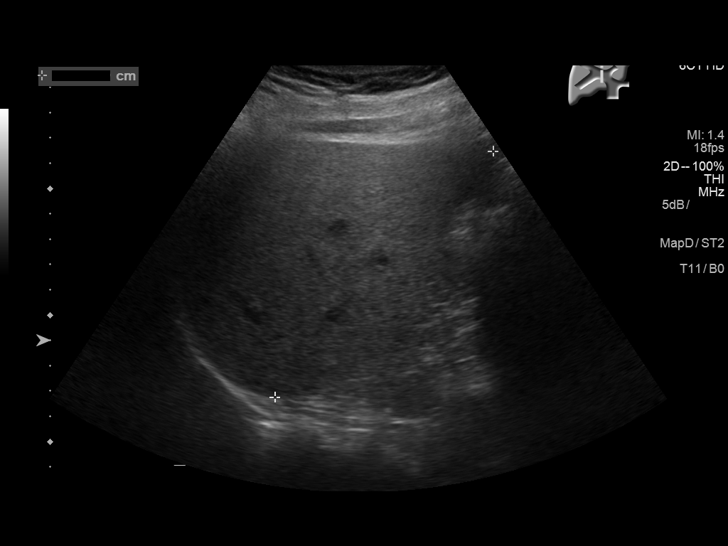
[im 30/90]
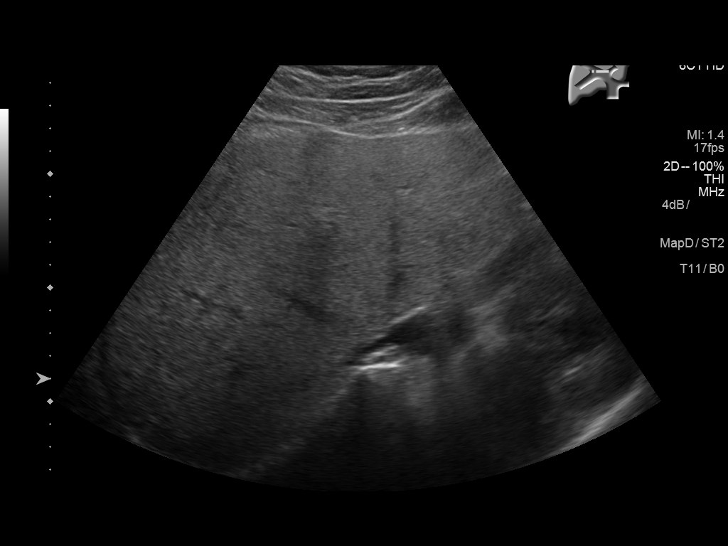
[im 34/90]
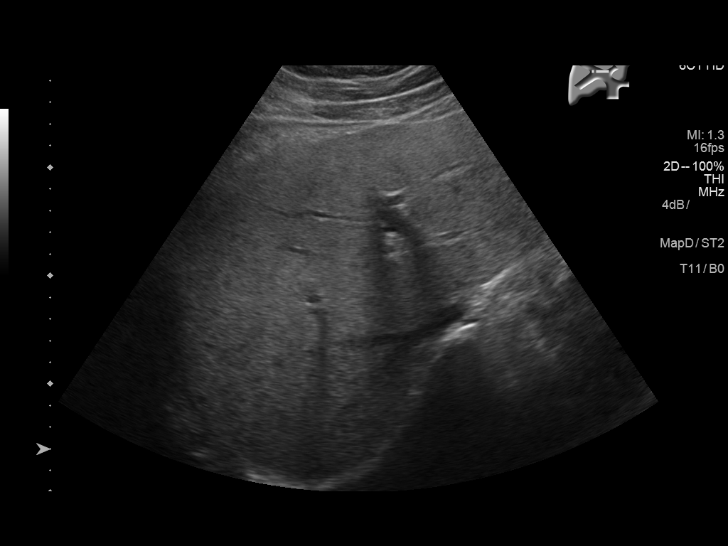
[im 41/90]
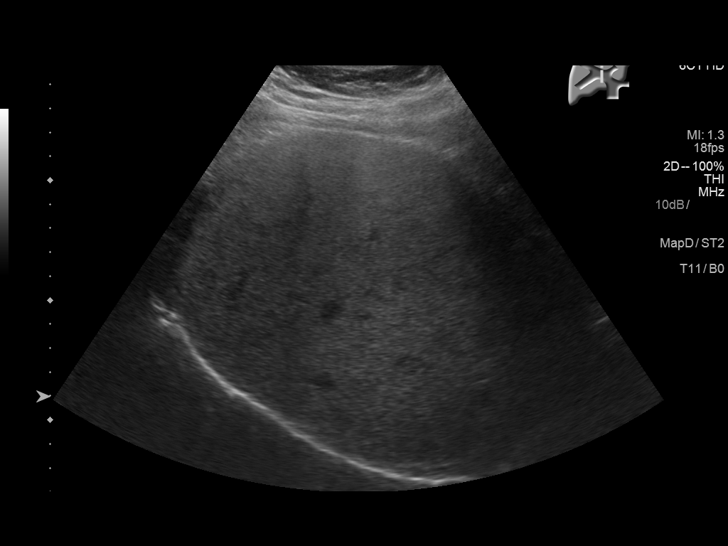
[im 49/90]
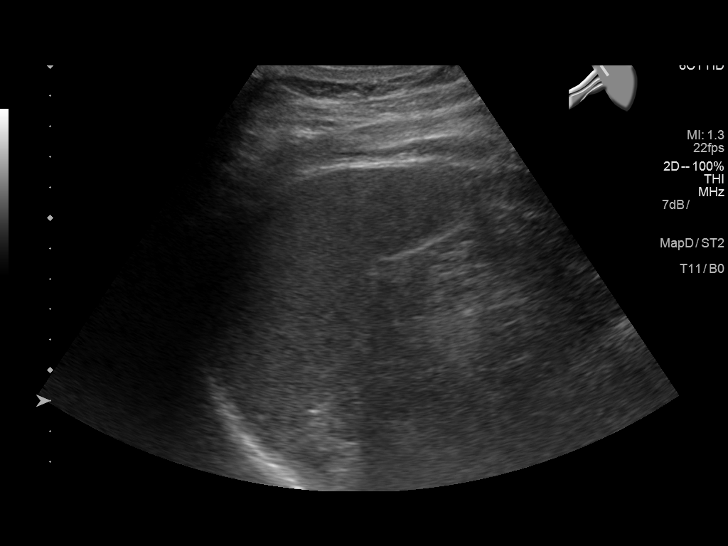
[im 56/90]
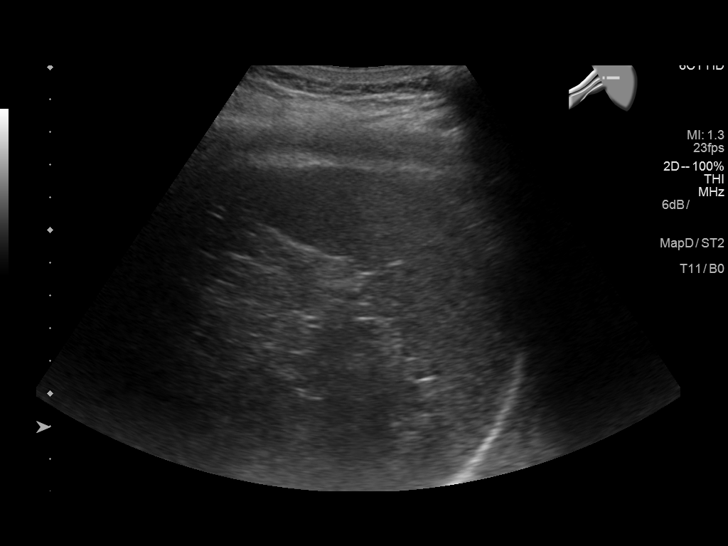
[im 60/90]
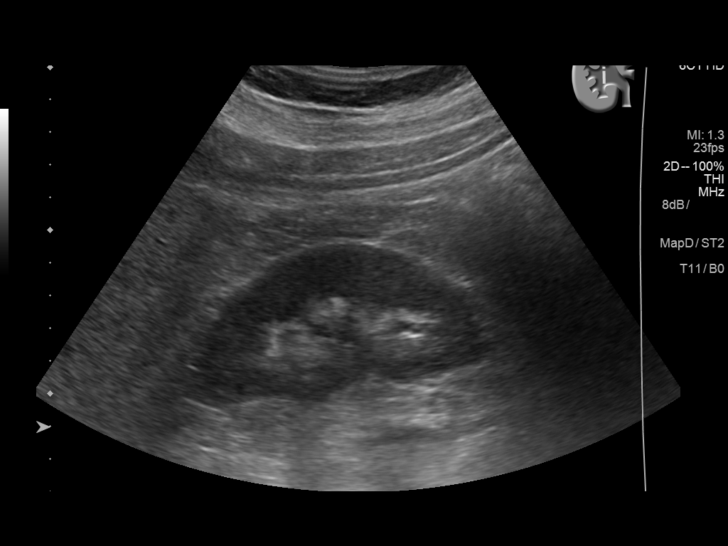
[im 67/90]
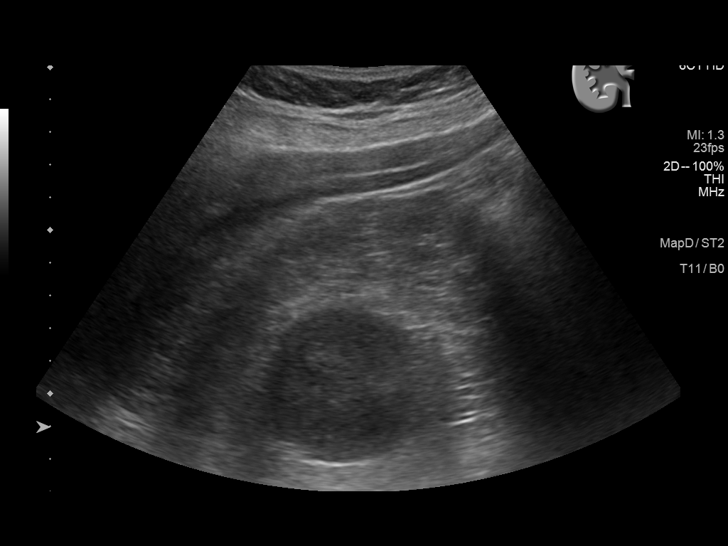
[im 75/90]
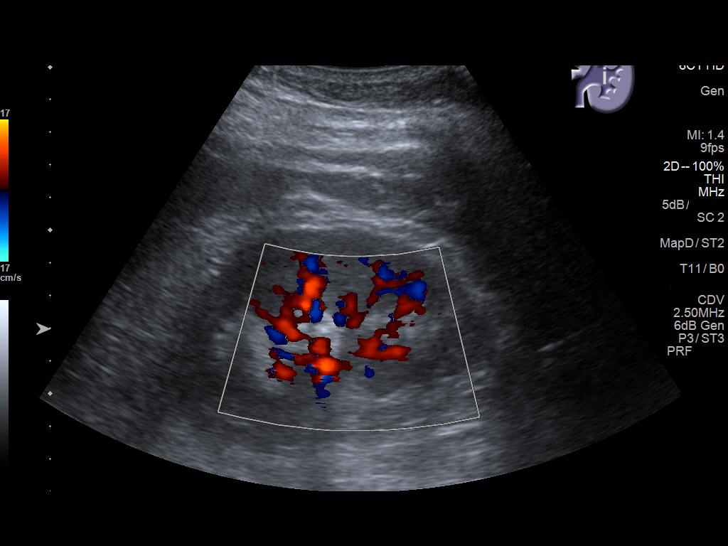
[im 82/90]
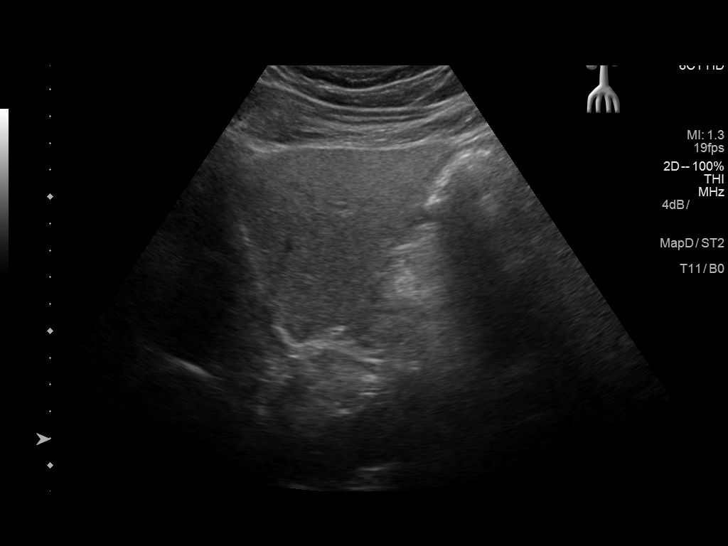
[im 90/90]
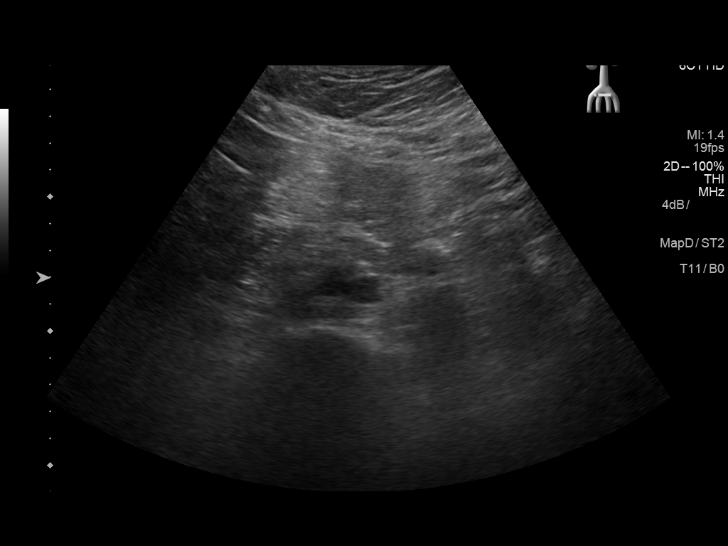

[14 of 25 positions shown; findings below may reference images not displayed]

FINDINGS: Gallbladder: No gallstones or wall thickening visualized. No
sonographic Murphy sign noted by sonographer.

Common bile duct: Diameter: 3.3 mm

Liver: No focal lesion identified. Increased hepatic parenchymal
echogenicity. Portal vein is patent on color Doppler imaging with
normal direction of blood flow towards the liver.

IVC: No abnormality visualized.

Pancreas: Visualized portion unremarkable.

Spleen: Size and appearance within normal limits.

Right Kidney: Length: 9.8 cm. Echogenicity within normal limits. No
mass or hydronephrosis visualized.

Left Kidney: Length: 10.4 cm. Echogenicity within normal limits. No
mass or hydronephrosis visualized.

Abdominal aorta: No aneurysm visualized.

Other findings: None.
IMPRESSION: 1. No cholelithiasis or sonographic evidence of acute cholecystitis.
2. Increased hepatic echogenicity as can be seen with hepatic
steatosis.
3. Normal spleen.

## 2020-03-19 MED ORDER — CYANOCOBALAMIN 1000 MCG/ML IJ SOLN
1000.0000 ug | Freq: Once | INTRAMUSCULAR | Status: AC
  Administered 2020-03-19: 1000 ug via INTRAMUSCULAR
  Filled 2020-03-19: qty 1

## 2020-04-16 ENCOUNTER — Other Ambulatory Visit: Payer: Self-pay

## 2020-04-16 ENCOUNTER — Inpatient Hospital Stay: Payer: Medicare PPO | Attending: Hematology and Oncology

## 2020-04-16 DIAGNOSIS — E538 Deficiency of other specified B group vitamins: Secondary | ICD-10-CM | POA: Diagnosis not present

## 2020-04-16 MED ORDER — CYANOCOBALAMIN 1000 MCG/ML IJ SOLN
1000.0000 ug | Freq: Once | INTRAMUSCULAR | Status: AC
  Administered 2020-04-16: 1000 ug via INTRAMUSCULAR

## 2020-04-16 MED ORDER — CYANOCOBALAMIN 1000 MCG/ML IJ SOLN
INTRAMUSCULAR | Status: AC
  Filled 2020-04-16: qty 1

## 2020-05-14 ENCOUNTER — Inpatient Hospital Stay: Payer: Medicare PPO | Attending: Hematology and Oncology

## 2020-05-14 ENCOUNTER — Other Ambulatory Visit: Payer: Self-pay

## 2020-05-14 DIAGNOSIS — Z79899 Other long term (current) drug therapy: Secondary | ICD-10-CM | POA: Insufficient documentation

## 2020-05-14 DIAGNOSIS — E538 Deficiency of other specified B group vitamins: Secondary | ICD-10-CM | POA: Insufficient documentation

## 2020-05-14 MED ORDER — CYANOCOBALAMIN 1000 MCG/ML IJ SOLN
1000.0000 ug | Freq: Once | INTRAMUSCULAR | Status: AC
  Administered 2020-05-14: 1000 ug via INTRAMUSCULAR

## 2020-05-14 MED ORDER — CYANOCOBALAMIN 1000 MCG/ML IJ SOLN
INTRAMUSCULAR | Status: AC
  Filled 2020-05-14: qty 1

## 2020-06-11 ENCOUNTER — Inpatient Hospital Stay: Payer: Medicare PPO | Attending: Hematology and Oncology

## 2020-06-11 ENCOUNTER — Other Ambulatory Visit: Payer: Self-pay

## 2020-06-11 DIAGNOSIS — E538 Deficiency of other specified B group vitamins: Secondary | ICD-10-CM | POA: Insufficient documentation

## 2020-06-11 MED ORDER — CYANOCOBALAMIN 1000 MCG/ML IJ SOLN
1000.0000 ug | Freq: Once | INTRAMUSCULAR | Status: AC
  Administered 2020-06-11: 1000 ug via INTRAMUSCULAR

## 2020-06-11 MED ORDER — CYANOCOBALAMIN 1000 MCG/ML IJ SOLN
INTRAMUSCULAR | Status: AC
  Filled 2020-06-11: qty 1

## 2020-07-09 ENCOUNTER — Inpatient Hospital Stay: Payer: Medicare PPO

## 2020-07-09 ENCOUNTER — Other Ambulatory Visit: Payer: Self-pay

## 2020-07-09 DIAGNOSIS — E538 Deficiency of other specified B group vitamins: Secondary | ICD-10-CM

## 2020-07-09 MED ORDER — CYANOCOBALAMIN 1000 MCG/ML IJ SOLN
1000.0000 ug | Freq: Once | INTRAMUSCULAR | Status: AC
  Administered 2020-07-09: 13:00:00 1000 ug via INTRAMUSCULAR
  Filled 2020-07-09: qty 1

## 2020-07-09 NOTE — Progress Notes (Signed)
Pt received b12 injection in clinic today. Tolerated well. 

## 2020-08-05 NOTE — Progress Notes (Signed)
[facility]  [address] [place], [place] [address] Phone: [phone]  Fax: [phone]   Clinic Day:  08/06/2020  Referring physician: Rolm Gala, MD  Chief Complaint: Cristian Rogers is a 75 y.o. male with thrombocytopenia and B12 deficiency who is seen for 1 year assessment.  HPI: The patient was last seen in the hematology clinic on 08/08/2019. At that time, he was doing well.  He denied any bruising or bleeding.  Exam was unremarkable. Hematocrit was 36.7, hemoglobin 12.8, MCV 91.3, platelets 123,000, WBC 7,300 (ANC 3900). He received a vitamin B12 injection.  He received vitamin B12 injections monthly x 12 (09/05/2019-07/09/2020).  Labs on 01/23/2020 revealed a hematocrit of 36.0, hemoglobin 12.8, MCV 89.6, platelets 116,000, WBC 5,800. Vitamin B12 was 464 and folate 40.0.  During the interim, he has been good. He has an itchy rash on both hands. He uses a cream for it, which helps. The sensory changes in his legs are stable. He denies infections, lumps, bumps, bruising, and bleeding.  He does not take folic acid.   Past Medical History:  Diagnosis Date  . Allergy   . Anemia   . Diabetes mellitus without complication [facility])     Past Surgical History:  Procedure Laterality Date  . INNER EAR SURGERY  1990    Family History  Problem Relation Age of Onset  . Cancer Mother   . Cancer Sister     Social History:  reports that he has never smoked. He has never used smokeless tobacco. He reports previous alcohol use. He reports that he does not use drugs. He drank as a teenager.He denies any exposure to radiation or toxins. His wife's name is Claris Che. 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  He lives in [place]. The patient is alone today.  Allergies: No Known Allergies  Current Medications: Current Outpatient Medications  Medication Sig Dispense Refill  . aspirin 81 MG chewable tablet Chew by mouth.    Marland Kitchen atorvastatin (LIPITOR)  40 MG tablet Take by mouth daily.    . Garlic 1000 MG CAPS Take by mouth.    . metFORMIN (GLUCOPHAGE-XR) 500 MG 24 hr tablet Take by mouth daily.    . MULTIPLE VITAMIN PO Take 1 tablet by mouth daily.    . Omega-3 Fatty Acids (FISH OIL) 1200 MG CAPS Take 1 capsule by mouth daily.     No current facility-administered medications for this visit.    Review of Systems  Constitutional: Positive for weight loss (4 lbs). Negative for chills, diaphoresis, fever and malaise/fatigue.       Feels good.  HENT: Negative.  Negative for congestion, ear discharge, ear pain, hearing loss, nosebleeds, sinus pain, sore throat and tinnitus.   Eyes: Negative.  Negative for blurred vision, double vision and photophobia.  Respiratory: Negative.  Negative for cough, hemoptysis, sputum production and shortness of breath.   Cardiovascular: Negative.  Negative for chest pain, palpitations, orthopnea, leg swelling and PND.  Gastrointestinal: Negative.  Negative for abdominal pain, blood in stool, constipation, diarrhea, heartburn, melena, nausea and vomiting.       Eating well.  Genitourinary: Negative.  Negative for dysuria, frequency, hematuria and urgency.  Musculoskeletal: Negative.  Negative for back pain, joint pain, myalgias and neck pain.  Skin: Positive for itching (hands) and rash (hands, uses cream).  Neurological: Positive for sensory change (in legs since fell on black ice in 2009, stable). Negative for dizziness, tingling, tremors, speech change, focal weakness, weakness and headaches.  Endo/Heme/Allergies: Negative.  Does not bruise/bleed easily.  Psychiatric/Behavioral: Negative.  Negative for depression and memory loss. The patient is not nervous/anxious and does not have insomnia.   All other systems reviewed and are negative.  Performance status (ECOG): 0  Vitals Blood pressure (!) 143/75, pulse 69, temperature 97.6 F (36.4 C), temperature source Tympanic, resp. rate 18, weight 223 lb 15.8 oz  (101.6 kg), SpO2 100 %.   Physical Exam Vitals and nursing note reviewed.  Constitutional:      General: He is not in acute distress.    Appearance: He is well-developed. He is not diaphoretic.  HENT:     Head: Normocephalic and atraumatic.     Comments: Black cap and gray beard.    Mouth/Throat:     Mouth: Mucous membranes are moist.     Pharynx: Oropharynx is clear. No oropharyngeal exudate.  Eyes:     General: No scleral icterus.    Conjunctiva/sclera: Conjunctivae normal.     Pupils: Pupils are equal, round, and reactive to light.     Comments: Brown eyes.  Neck:     Vascular: No JVD.  Cardiovascular:     Rate and Rhythm: Normal rate and regular rhythm.     Heart sounds: Normal heart sounds. No murmur heard.   Pulmonary:     Effort: Pulmonary effort is normal. No respiratory distress.     Breath sounds: Normal breath sounds. No wheezing or rales.  Chest:  Breasts:     Right: No supraclavicular adenopathy.     Left: No supraclavicular adenopathy.    Abdominal:     General: Bowel sounds are normal. There is no distension.     Palpations: Abdomen is soft.     Tenderness: There is no abdominal tenderness. There is no guarding or rebound.  Musculoskeletal:     Cervical back: Normal range of motion and neck supple.  Lymphadenopathy:     Head:     Right side of head: No preauricular, posterior auricular or occipital adenopathy.     Left side of head: No preauricular, posterior auricular or occipital adenopathy.     Cervical: No cervical adenopathy.     Upper Body:     Right upper body: No supraclavicular adenopathy.     Left upper body: No supraclavicular adenopathy.     Lower Body: No right inguinal adenopathy. No left inguinal adenopathy.  Skin:    General: Skin is warm and dry.     Coloration: Skin is not pale.     Findings: Rash (scaly, silvery rash on both hands) present. No erythema.  Neurological:     Mental Status: He is oriented to person, place, and time.   Psychiatric:        Behavior: Behavior normal.        Thought Content: Thought content normal.        Judgment: Judgment normal.    Appointment on 08/06/2020  Component Date Value Ref Range Status  . WBC 08/06/2020 6.6  4.0 - 10.5 K/uL Final  . RBC 08/06/2020 4.27  4.22 - 5.81 MIL/uL Final  . Hemoglobin 08/06/2020 13.5  13.0 - 17.0 g/dL Final  . HCT 47/42/5956 38.7* 39.0 - 52.0 % Final  . MCV 08/06/2020 90.6  80.0 - 100.0 fL Final  . MCH 08/06/2020 31.6  26.0 - 34.0 pg Final  . MCHC 08/06/2020 34.9  30.0 - 36.0 g/dL Final  . RDW 38/75/6433 12.7  11.5 - 15.5 % Final  . Platelets 08/06/2020 144*  150 - 400 K/uL Final  . nRBC 08/06/2020 0.0  0.0 - 0.2 % Final  . Neutrophils Relative % 08/06/2020 45  % Final  . Neutro Abs 08/06/2020 3.0  1.7 - 7.7 K/uL Final  . Lymphocytes Relative 08/06/2020 40  % Final  . Lymphs Abs 08/06/2020 2.7  0.7 - 4.0 K/uL Final  . Monocytes Relative 08/06/2020 10  % Final  . Monocytes Absolute 08/06/2020 0.7  0.1 - 1.0 K/uL Final  . Eosinophils Relative 08/06/2020 4  % Final  . Eosinophils Absolute 08/06/2020 0.2  0.0 - 0.5 K/uL Final  . Basophils Relative 08/06/2020 0  % Final  . Basophils Absolute 08/06/2020 0.0  0.0 - 0.1 K/uL Final  . Immature Granulocytes 08/06/2020 1  % Final  . Abs Immature Granulocytes 08/06/2020 0.04  0.00 - 0.07 K/uL Final   Performed at [facility], [address] [place], [place] [address]    Assessment:  Cristian Rogers is a 75 y.o. male  withmild thrombocytopenia andB12 deficiency. He denies any new medications or herbal products except for saw palmetto and Lipitor (incidence of thrombocytopenia <1%). He denies any alcohol use. Dietis fair.   Work-up on 05/29/2019revealed revealed a hematocrit of 38.4, hemoglobin 13.5, MCV 91.2, platelets 145,000, WBC 7100 with an ANC of 4000. Differential was unremarkable. Normal studiesincluded: ANA, hepatitis B core antibody total, hepatitis B surface antigen,  hepatitis C antibody, HIV testing, SPEP, and free light chains. PT and PTT were normal. Ferritin was 157. Iron saturation was 24% with a TIBC of 299. Folate was 12.9. Retic was 1.7%. B12was 201 (low).   Abdominal ultrasound on 02/20/2018 revealed a normal spleen and hepatic steatosis.  He has a a history of a mild normocytic anemia. Hemoglobin was 13.9 on 08/02/2018.  Last colonoscopy was <10 years ago.  He has B12 deficiency.B12 was 201 on 12/07/2017.He received weekly B12 x 6 (12/14/2017 - 01/18/2018)then monthly (last 07/09/2020).Anti-parietal cell antibodies and intrinsic factor antibodies were negative on 02/15/2018.  Folate was 40 on 01/23/2020.  He has afamily history of malignancy. His father died of prostate cancer. His mother and sister died of breast cancer. PSA was 1.75.   The patient received the Moderna COVID-19 vaccine on 08/25/2019 and 09/22/2019. He also received the Booster. He received the flu shot.  Symptomatically, he feels good.  He denies any bruising or bleeding.  Exam is stable.  Platelet count is 144,000.  Plan: 1.   Labs today:CBC with diff. 2.   Thrombocytopenia Platelet countis 144,000. He denies any bruising or bleeding.   Exam reveals no adenopathy or hepatosplenomegaly.   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  Continue to monitor. 3.   B12 deficiency B12 was 201 on 12/07/2017 and 464 on 01/23/2020. B12 today and monthly x12.  Folate was 40 on 01/23/2020.  Patient does not take folic acid. 4.   B12 today and monthly x 12. 5.   RTC in 6 months for labs (CBC with diff, B12 and folate). 6.   RTC in 1 year for MD assess and llabs (CBC with diff) and B12.  I discussed the assessment and treatment plan with the patient.  The patient was provided an opportunity to ask questions and all were answered.  The patient agreed with the plan and demonstrated an understanding of the instructions.  The patient was advised to call back  if the symptoms worsen or if the condition fails to improve as anticipated.   Rosey BathMelissa C Darion Juhasz, MD,  PhD    08/06/2020, 2:15 PM  I, Danella Penton Tufford, am acting as a Neurosurgeon for Rosey Bath, MD.  I, Shian Goodnow C. Merlene Pulling, MD, have reviewed the above documentation for accuracy and completeness, and I agree with the above.

## 2020-08-06 ENCOUNTER — Inpatient Hospital Stay: Payer: Medicare PPO

## 2020-08-06 ENCOUNTER — Encounter: Payer: Self-pay | Admitting: Hematology and Oncology

## 2020-08-06 ENCOUNTER — Other Ambulatory Visit: Payer: Self-pay

## 2020-08-06 ENCOUNTER — Inpatient Hospital Stay: Payer: Medicare PPO | Attending: Hematology and Oncology | Admitting: Hematology and Oncology

## 2020-08-06 VITALS — BP 143/75 | HR 69 | Temp 97.6°F | Resp 18 | Wt 224.0 lb

## 2020-08-06 DIAGNOSIS — D696 Thrombocytopenia, unspecified: Secondary | ICD-10-CM

## 2020-08-06 DIAGNOSIS — E538 Deficiency of other specified B group vitamins: Secondary | ICD-10-CM | POA: Insufficient documentation

## 2020-08-06 DIAGNOSIS — Z79899 Other long term (current) drug therapy: Secondary | ICD-10-CM | POA: Insufficient documentation

## 2020-08-06 DIAGNOSIS — D693 Immune thrombocytopenic purpura: Secondary | ICD-10-CM | POA: Insufficient documentation

## 2020-08-06 LAB — CBC WITH DIFFERENTIAL/PLATELET
Abs Immature Granulocytes: 0.04 10*3/uL (ref 0.00–0.07)
Basophils Absolute: 0 10*3/uL (ref 0.0–0.1)
Basophils Relative: 0 %
Eosinophils Absolute: 0.2 10*3/uL (ref 0.0–0.5)
Eosinophils Relative: 4 %
HCT: 38.7 % — ABNORMAL LOW (ref 39.0–52.0)
Hemoglobin: 13.5 g/dL (ref 13.0–17.0)
Immature Granulocytes: 1 %
Lymphocytes Relative: 40 %
Lymphs Abs: 2.7 10*3/uL (ref 0.7–4.0)
MCH: 31.6 pg (ref 26.0–34.0)
MCHC: 34.9 g/dL (ref 30.0–36.0)
MCV: 90.6 fL (ref 80.0–100.0)
Monocytes Absolute: 0.7 10*3/uL (ref 0.1–1.0)
Monocytes Relative: 10 %
Neutro Abs: 3 10*3/uL (ref 1.7–7.7)
Neutrophils Relative %: 45 %
Platelets: 144 10*3/uL — ABNORMAL LOW (ref 150–400)
RBC: 4.27 MIL/uL (ref 4.22–5.81)
RDW: 12.7 % (ref 11.5–15.5)
WBC: 6.6 10*3/uL (ref 4.0–10.5)
nRBC: 0 % (ref 0.0–0.2)

## 2020-08-06 MED ORDER — CYANOCOBALAMIN 1000 MCG/ML IJ SOLN
INTRAMUSCULAR | Status: AC
  Filled 2020-08-06: qty 1

## 2020-08-06 MED ORDER — CYANOCOBALAMIN 1000 MCG/ML IJ SOLN
1000.0000 ug | Freq: Once | INTRAMUSCULAR | Status: AC
  Administered 2020-08-06: 1000 ug via INTRAMUSCULAR

## 2020-08-06 NOTE — Progress Notes (Signed)
Patient doesn't have any concerns for todays

## 2020-09-10 ENCOUNTER — Inpatient Hospital Stay: Payer: Medicare PPO | Attending: Hematology and Oncology

## 2020-09-10 ENCOUNTER — Other Ambulatory Visit: Payer: Self-pay

## 2020-09-10 DIAGNOSIS — E538 Deficiency of other specified B group vitamins: Secondary | ICD-10-CM | POA: Insufficient documentation

## 2020-09-10 DIAGNOSIS — Z79899 Other long term (current) drug therapy: Secondary | ICD-10-CM | POA: Diagnosis not present

## 2020-09-10 MED ORDER — CYANOCOBALAMIN 1000 MCG/ML IJ SOLN
1000.0000 ug | Freq: Once | INTRAMUSCULAR | Status: AC
  Administered 2020-09-10: 1000 ug via INTRAMUSCULAR
  Filled 2020-09-10: qty 1

## 2020-09-12 ENCOUNTER — Ambulatory Visit
Admission: EM | Admit: 2020-09-12 | Discharge: 2020-09-12 | Disposition: A | Discharge status: Home / Self Care | Payer: Medicare PPO | Attending: Emergency Medicine | Admitting: Emergency Medicine

## 2020-09-12 ENCOUNTER — Other Ambulatory Visit: Payer: Self-pay

## 2020-09-12 ENCOUNTER — Encounter: Payer: Self-pay | Admitting: Emergency Medicine

## 2020-09-12 DIAGNOSIS — Z23 Encounter for immunization: Secondary | ICD-10-CM

## 2020-09-12 DIAGNOSIS — S91331A Puncture wound without foreign body, right foot, initial encounter: Secondary | ICD-10-CM | POA: Diagnosis not present

## 2020-09-12 MED ORDER — CEPHALEXIN 500 MG PO CAPS: 500.0000 mg | ORAL_CAPSULE | Freq: Three times a day (TID) | ORAL | 0 refills | Status: AC

## 2020-09-12 MED ORDER — LEVOFLOXACIN 500 MG PO TABS: 500.0000 mg | ORAL_TABLET | Freq: Every day | ORAL | 0 refills | Status: AC

## 2020-09-12 MED ORDER — TETANUS-DIPHTHERIA TOXOIDS TD 5-2 LFU IM INJ
0.5000 mL | INJECTION | Freq: Once | INTRAMUSCULAR | Status: AC
  Administered 2020-09-12: 0.5 mL via INTRAMUSCULAR

## 2020-09-12 NOTE — Discharge Instructions (Addendum)
Keep soaking your foot twice a day with Epson salts and hydroperoxide as you have been doing previously.  Take the Keflex 3 times a day for 5 days and the Levaquin once daily for 5 days for preventative treatment for the puncture wound to prevent infection.  Keep your right foot elevated and open to air as much as possible.  If you develop any fever, redness, swelling, or drainage from your foot you need to go to the ER for evaluation and IV antibiotics.

## 2020-09-12 NOTE — ED Provider Notes (Signed)
[facility]    CSN: [id number] Arrival date & time: 09/12/20  1508      History   Chief Complaint Chief Complaint  Patient presents with  . Puncture Wound    Right foot    HPI Cristian Rogers is a 75 y.o. male.   HPI   75 year old male here for evaluation of a puncture wound to his right foot.  Patient reports that 2 days ago he stepped on a U-shaped tack and it was in his foot for for 5 hours before he knew about it.  Patient has a history of diabetes and has diabetic neuropathy in his feet.  Patient came in today to get a tetanus shot as his is not up-to-date.  Patient states that he has been using a mixture of Epson salts, alcohol, and hydroperoxide at home to soak his feet twice a day.  Patient has not had any fever, swelling to his foot, redness to his foot, or drainage from the puncture wound.  Ports that he was wearing sneakers when the injury happened.  Past Medical History:  Diagnosis Date  . Allergy   . Anemia   . Diabetes mellitus without complication [facility])     Patient Active Problem List   Diagnosis Date Noted  . B12 deficiency 12/14/2017  . Thrombocytopenia (HCC) 12/07/2017    Past Surgical History:  Procedure Laterality Date  . INNER EAR SURGERY  1990       Home Medications    Prior to Admission medications   Medication Sig Start Date End Date Taking? Authorizing Provider  aspirin 81 MG chewable tablet Chew by mouth.   Yes [provider]  atorvastatin (LIPITOR) 40 MG tablet Take by mouth daily. 11/17/17 09/12/20 Yes [provider]  cephALEXin (KEFLEX) 500 MG capsule Take 1 capsule (500 mg total) by mouth 3 (three) times daily for 5 days. 09/12/20 09/17/20 Yes Becky Augusta, NP  Garlic 1000 MG CAPS Take by mouth.   Yes [provider]  levofloxacin (LEVAQUIN) 500 MG tablet Take 1 tablet (500 mg total) by mouth daily for 5 days. 09/12/20 09/17/20 Yes Becky Augusta, NP  metFORMIN (GLUCOPHAGE-XR) 500 MG 24 hr tablet Take by  mouth daily. 11/07/17 09/12/20 Yes [provider]  MULTIPLE VITAMIN PO Take 1 tablet by mouth daily.   Yes [provider]  Omega-3 Fatty Acids (FISH OIL) 1200 MG CAPS Take 1 capsule by mouth daily.   Yes [provider]    Family History Family History  Problem Relation Age of Onset  . Cancer Mother   . Cancer Sister     Social History Social History   Tobacco Use  . Smoking status: Never Smoker  . Smokeless tobacco: Never Used  Vaping Use  . Vaping Use: Never used  Substance Use Topics  . Alcohol use: Not Currently  . Drug use: Never     Allergies   Patient has no known allergies.   Review of Systems Review of Systems  Constitutional: Negative for fever.  Musculoskeletal: Negative for arthralgias, joint swelling and myalgias.  Skin: Positive for wound. Negative for color change.  Neurological: Positive for numbness.     Physical Exam Triage Vital Signs ED Triage Vitals [09/12/20 1517]  Enc Vitals Group     BP      Pulse      Resp      Temp      Temp src      SpO2  Weight      Height      Head Circumference      Peak Flow      Pain Score 0     Pain Loc      Pain Edu?      Excl. in GC?    No data found.  Updated Vital Signs BP 138/85 (BP Location: Right Arm)   Pulse 84   Temp 98.7 F (37.1 C) (Temporal) Comment: Patient was eating a hot sandwich prior to temperature reading so take a temporal temp.  Resp 16   Ht 5\' 7"  (1.702 m)   Wt 230 lb (104.3 kg)   SpO2 99%   BMI 36.02 kg/m   Visual Acuity Right Eye Distance:   Left Eye Distance:   Bilateral Distance:    Right Eye Near:   Left Eye Near:    Bilateral Near:     Physical Exam Vitals and nursing note reviewed.  Constitutional:      General: He is not in acute distress.    Appearance: Normal appearance. He is not ill-appearing.  HENT:     Head: Normocephalic and atraumatic.  Musculoskeletal:        General: Signs of injury present. No swelling or  tenderness. Normal range of motion.  Skin:    General: Skin is warm and dry.     Capillary Refill: Capillary refill takes less than 2 seconds.     Findings: No bruising or erythema.  Neurological:     General: No focal deficit present.     Mental Status: He is alert and oriented to person, place, and time.     Motor: No weakness.  Psychiatric:        Mood and Affect: Mood normal.        Behavior: Behavior normal.        Thought Content: Thought content normal.        Judgment: Judgment normal.      UC Treatments / Results  Labs (all labs ordered are listed, but only abnormal results are displayed) Labs Reviewed - No data to display  EKG   Radiology No results found.  Procedures Procedures (including critical care time)  Medications Ordered in UC Medications  tetanus & diphtheria toxoids (adult) (TENIVAC) injection 0.5 mL (0.5 mLs Intramuscular Given 09/12/20 1521)    Initial Impression / Assessment and Plan / UC Course  I have reviewed the triage vital signs and the nursing notes.  Pertinent labs & imaging results that were available during my care of the patient were reviewed by me and considered in my medical decision making (see chart for details).   Patient is a very pleasant 75 year old male here for evaluation of a puncture wound to the sole of his right foot.  Patient reports that he was dealing with some trash and stepped on a U-shaped nail or carpet tack.  He thinks he had the attack in the sole of his foot for for 5 hours before he realized it.  Patient has diabetic neuropathy and did not feel it.  He did not find the tach until he took his shoe off later that night.  Physical exam reveals a puncture wound to the posterior lateral aspect of the right foot.  There is no ecchymosis or erythema surrounding the puncture wound and there is no drainage coming from the wound.  Patient foot is normal in color without edema, heat, or erythema.  Patient's tetanus shot will be  updated and due  to the fact that the patient was wearing tennis shoes when the puncture occurred will place patient on Keflex and Levaquin for 5 days each.   Final Clinical Impressions(s) / UC Diagnoses   Final diagnoses:  Puncture wound of plantar aspect of right foot, initial encounter     Discharge Instructions     Keep soaking your foot twice a day with Epson salts and hydroperoxide as you have been doing previously.  Take the Keflex 3 times a day for 5 days and the Levaquin once daily for 5 days for preventative treatment for the puncture wound to prevent infection.  Keep your right foot elevated and open to air as much as possible.  If you develop any fever, redness, swelling, or drainage from your foot you need to go to the ER for evaluation and IV antibiotics.    ED Prescriptions    Medication Sig Dispense Auth. Provider   cephALEXin (KEFLEX) 500 MG capsule Take 1 capsule (500 mg total) by mouth 3 (three) times daily for 5 days. 15 capsule Becky Augusta, NP   levofloxacin (LEVAQUIN) 500 MG tablet Take 1 tablet (500 mg total) by mouth daily for 5 days. 5 tablet Becky Augusta, NP     PDMP not reviewed this encounter.   Becky Augusta, NP 09/12/20 1540

## 2020-09-12 NOTE — ED Triage Notes (Signed)
Patient states he stepped on a metal clamp with his right foot yesterday.  Patient states that he is here for a tetanus vaccine.

## 2020-11-05 ENCOUNTER — Other Ambulatory Visit: Payer: Self-pay

## 2020-11-05 ENCOUNTER — Inpatient Hospital Stay: Payer: Medicare PPO | Attending: Hematology and Oncology

## 2020-11-05 DIAGNOSIS — E538 Deficiency of other specified B group vitamins: Secondary | ICD-10-CM | POA: Diagnosis present

## 2020-11-05 DIAGNOSIS — Z79899 Other long term (current) drug therapy: Secondary | ICD-10-CM | POA: Diagnosis not present

## 2020-11-05 MED ORDER — CYANOCOBALAMIN 1000 MCG/ML IJ SOLN
1000.0000 ug | Freq: Once | INTRAMUSCULAR | Status: AC
  Administered 2020-11-05: 1000 ug via INTRAMUSCULAR
  Filled 2020-11-05: qty 1

## 2020-12-03 ENCOUNTER — Inpatient Hospital Stay: Payer: Medicare PPO | Attending: Oncology

## 2020-12-03 ENCOUNTER — Other Ambulatory Visit: Payer: Self-pay

## 2020-12-03 DIAGNOSIS — E538 Deficiency of other specified B group vitamins: Secondary | ICD-10-CM | POA: Insufficient documentation

## 2020-12-03 DIAGNOSIS — Z79899 Other long term (current) drug therapy: Secondary | ICD-10-CM | POA: Diagnosis not present

## 2020-12-03 MED ORDER — CYANOCOBALAMIN 1000 MCG/ML IJ SOLN
1000.0000 ug | Freq: Once | INTRAMUSCULAR | Status: AC
Start: 2020-12-03 — End: 2020-12-03
  Administered 2020-12-03: 1000 ug via INTRAMUSCULAR

## 2020-12-31 ENCOUNTER — Inpatient Hospital Stay: Payer: Medicare PPO | Attending: Hematology and Oncology

## 2020-12-31 ENCOUNTER — Other Ambulatory Visit: Payer: Self-pay

## 2020-12-31 DIAGNOSIS — Z79899 Other long term (current) drug therapy: Secondary | ICD-10-CM | POA: Diagnosis not present

## 2020-12-31 DIAGNOSIS — E538 Deficiency of other specified B group vitamins: Secondary | ICD-10-CM | POA: Insufficient documentation

## 2020-12-31 MED ORDER — CYANOCOBALAMIN 1000 MCG/ML IJ SOLN
1000.0000 ug | Freq: Once | INTRAMUSCULAR | Status: AC
Start: 2020-12-31 — End: 2020-12-31
  Administered 2020-12-31: 1000 ug via INTRAMUSCULAR

## 2021-02-03 ENCOUNTER — Other Ambulatory Visit: Payer: Self-pay

## 2021-02-03 DIAGNOSIS — E538 Deficiency of other specified B group vitamins: Secondary | ICD-10-CM

## 2021-02-04 ENCOUNTER — Inpatient Hospital Stay: Payer: Medicare PPO | Attending: Hematology and Oncology

## 2021-02-04 ENCOUNTER — Other Ambulatory Visit: Payer: Self-pay

## 2021-02-04 ENCOUNTER — Inpatient Hospital Stay: Payer: Medicare PPO

## 2021-02-04 DIAGNOSIS — E538 Deficiency of other specified B group vitamins: Secondary | ICD-10-CM | POA: Diagnosis present

## 2021-02-04 DIAGNOSIS — Z79899 Other long term (current) drug therapy: Secondary | ICD-10-CM | POA: Diagnosis not present

## 2021-02-04 LAB — CBC WITH DIFFERENTIAL/PLATELET
Abs Immature Granulocytes: 0.02 10*3/uL (ref 0.00–0.07)
Basophils Absolute: 0 10*3/uL (ref 0.0–0.1)
Basophils Relative: 0 %
Eosinophils Absolute: 0.1 10*3/uL (ref 0.0–0.5)
Eosinophils Relative: 3 %
HCT: 36.4 % — ABNORMAL LOW (ref 39.0–52.0)
Hemoglobin: 13 g/dL (ref 13.0–17.0)
Immature Granulocytes: 0 %
Lymphocytes Relative: 32 %
Lymphs Abs: 1.6 10*3/uL (ref 0.7–4.0)
MCH: 32.7 pg (ref 26.0–34.0)
MCHC: 35.7 g/dL (ref 30.0–36.0)
MCV: 91.7 fL (ref 80.0–100.0)
Monocytes Absolute: 0.4 10*3/uL (ref 0.1–1.0)
Monocytes Relative: 7 %
Neutro Abs: 3 10*3/uL (ref 1.7–7.7)
Neutrophils Relative %: 58 %
Platelets: 126 10*3/uL — ABNORMAL LOW (ref 150–400)
RBC: 3.97 MIL/uL — ABNORMAL LOW (ref 4.22–5.81)
RDW: 12.4 % (ref 11.5–15.5)
WBC: 5.1 10*3/uL (ref 4.0–10.5)
nRBC: 0 % (ref 0.0–0.2)

## 2021-02-04 LAB — VITAMIN B12: Vitamin B-12: 528 pg/mL (ref 180–914)

## 2021-02-04 LAB — FOLATE: Folate: 16.9 ng/mL (ref >5.9)

## 2021-02-04 MED ORDER — CYANOCOBALAMIN 1000 MCG/ML IJ SOLN
1000.0000 ug | Freq: Once | INTRAMUSCULAR | Status: AC
  Administered 2021-02-04: 1000 ug via INTRAMUSCULAR

## 2021-03-04 ENCOUNTER — Inpatient Hospital Stay: Payer: Medicare PPO | Attending: Hematology and Oncology

## 2021-03-04 ENCOUNTER — Other Ambulatory Visit: Payer: Self-pay

## 2021-03-04 DIAGNOSIS — Z79899 Other long term (current) drug therapy: Secondary | ICD-10-CM | POA: Diagnosis not present

## 2021-03-04 DIAGNOSIS — E538 Deficiency of other specified B group vitamins: Secondary | ICD-10-CM | POA: Insufficient documentation

## 2021-03-04 MED ORDER — CYANOCOBALAMIN 1000 MCG/ML IJ SOLN
1000.0000 ug | Freq: Once | INTRAMUSCULAR | Status: AC
  Administered 2021-03-04: 1000 ug via INTRAMUSCULAR

## 2021-04-22 ENCOUNTER — Other Ambulatory Visit: Payer: Self-pay

## 2021-04-22 ENCOUNTER — Telehealth: Payer: Self-pay | Admitting: Oncology

## 2021-04-22 DIAGNOSIS — E538 Deficiency of other specified B group vitamins: Secondary | ICD-10-CM

## 2021-04-22 DIAGNOSIS — D696 Thrombocytopenia, unspecified: Secondary | ICD-10-CM

## 2021-04-22 NOTE — Telephone Encounter (Signed)
Yes looks like Dr. Cathie Hoops Patient who has seen dr Merlene Pulling in the past and getting monthly b12 injections. I will defer decision to dr Cathie Hoops in that case to continue b12 injections

## 2021-04-22 NOTE — Telephone Encounter (Signed)
Pt called and stated he needed to schedule more B12 injections. I didn't see a note to schedule anything, I told him I would reach out to the MD and see what needs to be scheduled.

## 2021-04-22 NOTE — Telephone Encounter (Signed)
Can you schedule him for monthly b12 injections starting this or next week

## 2021-04-22 NOTE — Telephone Encounter (Signed)
Abbie--Just confirming, looks like this a Dr. Cathie Hoops patient? I tried to call him x 2.   Thanks! Britt Boozer

## 2021-04-23 NOTE — Telephone Encounter (Signed)
Called pt a couple times today and no answer. VM left for pt to return call.

## 2021-04-28 NOTE — Telephone Encounter (Signed)
Pt was in [place] today and message was relayed to him about B12 recommendations. Pt decided to do Oral B12 daily and will pickup OTC.

## 2021-08-04 ENCOUNTER — Other Ambulatory Visit: Payer: Self-pay | Admitting: *Deleted

## 2021-08-06 ENCOUNTER — Other Ambulatory Visit: Payer: Medicare PPO

## 2021-08-06 ENCOUNTER — Ambulatory Visit: Payer: Medicare PPO

## 2021-08-06 ENCOUNTER — Ambulatory Visit: Payer: Medicare PPO | Admitting: Oncology

## 2021-08-10 ENCOUNTER — Inpatient Hospital Stay: Payer: Medicare PPO

## 2021-08-10 ENCOUNTER — Inpatient Hospital Stay: Payer: Medicare PPO | Admitting: Oncology

## 2021-08-10 ENCOUNTER — Inpatient Hospital Stay: Payer: Medicare PPO | Attending: Oncology

## 2021-12-02 ENCOUNTER — Encounter: Payer: Self-pay | Admitting: Hematology and Oncology

## 2022-08-17 ENCOUNTER — Ambulatory Visit: Admit: 2022-08-17 | Payer: Medicare PPO

## 2022-08-17 SURGERY — COLONOSCOPY WITH PROPOFOL
Anesthesia: General
